# Patient Record
Sex: Male | Born: 1970
Health system: Southern US, Community
[De-identification: ages and names within clinical notes are randomized; demographics above are authoritative.]

## PROBLEM LIST (undated history)

## (undated) DIAGNOSIS — E785 Hyperlipidemia, unspecified: Secondary | ICD-10-CM

## (undated) DIAGNOSIS — I10 Essential (primary) hypertension: Secondary | ICD-10-CM

## (undated) DIAGNOSIS — I2510 Atherosclerotic heart disease of native coronary artery without angina pectoris: Secondary | ICD-10-CM

## (undated) HISTORY — PX: CORONARY ARTERY BYPASS GRAFT: SHX141

## (undated) HISTORY — DX: Atherosclerotic heart disease of native coronary artery without angina pectoris: I25.10

---

## 2011-07-16 ENCOUNTER — Inpatient Hospital Stay (HOSPITAL_COMMUNITY): Payer: BC Managed Care – PPO

## 2011-07-16 ENCOUNTER — Inpatient Hospital Stay (HOSPITAL_COMMUNITY)
Admission: RE | Admit: 2011-07-16 | Discharge: 2011-07-21 | DRG: 107 | Disposition: A | Discharge status: Home / Self Care | Payer: BC Managed Care – PPO | Source: Other Acute Inpatient Hospital | Attending: Cardiothoracic Surgery | Admitting: Cardiothoracic Surgery

## 2011-07-16 ENCOUNTER — Emergency Department (HOSPITAL_BASED_OUTPATIENT_CLINIC_OR_DEPARTMENT_OTHER)
Admission: EM | Admit: 2011-07-16 | Discharge: 2011-07-16 | Disposition: A | Discharge status: Other Acute Inpatient Hospital | Payer: BC Managed Care – PPO | Source: Home / Self Care | Attending: Emergency Medicine | Admitting: Emergency Medicine

## 2011-07-16 ENCOUNTER — Encounter: Payer: Self-pay | Admitting: *Deleted

## 2011-07-16 ENCOUNTER — Emergency Department (INDEPENDENT_AMBULATORY_CARE_PROVIDER_SITE_OTHER): Payer: BC Managed Care – PPO

## 2011-07-16 ENCOUNTER — Other Ambulatory Visit: Payer: Self-pay

## 2011-07-16 ENCOUNTER — Encounter (HOSPITAL_COMMUNITY): Payer: Self-pay

## 2011-07-16 DIAGNOSIS — I2510 Atherosclerotic heart disease of native coronary artery without angina pectoris: Secondary | ICD-10-CM

## 2011-07-16 DIAGNOSIS — R079 Chest pain, unspecified: Secondary | ICD-10-CM | POA: Insufficient documentation

## 2011-07-16 DIAGNOSIS — I498 Other specified cardiac arrhythmias: Secondary | ICD-10-CM | POA: Diagnosis present

## 2011-07-16 DIAGNOSIS — I10 Essential (primary) hypertension: Secondary | ICD-10-CM | POA: Diagnosis present

## 2011-07-16 DIAGNOSIS — I214 Non-ST elevation (NSTEMI) myocardial infarction: Principal | ICD-10-CM | POA: Diagnosis present

## 2011-07-16 DIAGNOSIS — D62 Acute posthemorrhagic anemia: Secondary | ICD-10-CM | POA: Diagnosis not present

## 2011-07-16 DIAGNOSIS — Z01810 Encounter for preprocedural cardiovascular examination: Secondary | ICD-10-CM

## 2011-07-16 DIAGNOSIS — R0789 Other chest pain: Secondary | ICD-10-CM

## 2011-07-16 DIAGNOSIS — R51 Headache: Secondary | ICD-10-CM | POA: Diagnosis not present

## 2011-07-16 DIAGNOSIS — E785 Hyperlipidemia, unspecified: Secondary | ICD-10-CM | POA: Diagnosis present

## 2011-07-16 DIAGNOSIS — E8779 Other fluid overload: Secondary | ICD-10-CM | POA: Diagnosis not present

## 2011-07-16 DIAGNOSIS — I4891 Unspecified atrial fibrillation: Secondary | ICD-10-CM | POA: Diagnosis not present

## 2011-07-16 HISTORY — DX: Essential (primary) hypertension: I10

## 2011-07-16 HISTORY — DX: Hyperlipidemia, unspecified: E78.5

## 2011-07-16 LAB — CARDIAC PANEL(CRET KIN+CKTOT+MB+TROPI)
CK, MB: 8.1 ng/mL (ref 0.3–4.0)
CK, MB: 8.4 ng/mL (ref 0.3–4.0)
CK, MB: 5.6 ng/mL — ABNORMAL HIGH (ref 0.3–4.0)
Relative Index: 3.9 — ABNORMAL HIGH (ref 0.0–2.5)
Relative Index: 4.3 — ABNORMAL HIGH (ref 0.0–2.5)
Relative Index: 3.6 — ABNORMAL HIGH (ref 0.0–2.5)
Total CK: 207 U/L (ref 7–232)
Total CK: 195 U/L (ref 7–232)
Total CK: 154 U/L (ref 7–232)
Troponin I: 2.36 ng/mL (ref <0.30)
Troponin I: 2.87 ng/mL (ref <0.30)
Troponin I: 1.32 ng/mL (ref <0.30)

## 2011-07-16 LAB — CBC
HCT: 41.1 % (ref 39.0–52.0)
HCT: 39.4 % (ref 39.0–52.0)
Hemoglobin: 13.7 g/dL (ref 13.0–17.0)
Hemoglobin: 12.8 g/dL — ABNORMAL LOW (ref 13.0–17.0)
MCH: 20.5 pg — ABNORMAL LOW (ref 26.0–34.0)
MCH: 20.6 pg — ABNORMAL LOW (ref 26.0–34.0)
MCHC: 33.3 g/dL (ref 30.0–36.0)
MCHC: 32.5 g/dL (ref 30.0–36.0)
MCV: 61.4 fL — ABNORMAL LOW (ref 78.0–100.0)
MCV: 63.3 fL — ABNORMAL LOW (ref 78.0–100.0)
Platelets: 252 10*3/uL (ref 150–400)
Platelets: 232 10*3/uL (ref 150–400)
RBC: 6.69 MIL/uL — ABNORMAL HIGH (ref 4.22–5.81)
RBC: 6.22 MIL/uL — ABNORMAL HIGH (ref 4.22–5.81)
RDW: 18.4 % — ABNORMAL HIGH (ref 11.5–15.5)
RDW: 15.2 % (ref 11.5–15.5)
WBC: 7.2 10*3/uL (ref 4.0–10.5)
WBC: 7.9 10*3/uL (ref 4.0–10.5)

## 2011-07-16 LAB — DIFFERENTIAL
Basophils Absolute: 0 10*3/uL (ref 0.0–0.1)
Basophils Relative: 0 % (ref 0–1)
Eosinophils Absolute: 0.1 10*3/uL (ref 0.0–0.7)
Eosinophils Relative: 2 % (ref 0–5)
Lymphocytes Relative: 39 % (ref 12–46)
Lymphs Abs: 2.8 10*3/uL (ref 0.7–4.0)
Monocytes Absolute: 0.6 10*3/uL (ref 0.1–1.0)
Monocytes Relative: 8 % (ref 3–12)
Neutro Abs: 3.7 10*3/uL (ref 1.7–7.7)
Neutrophils Relative %: 51 % (ref 43–77)

## 2011-07-16 LAB — PROTIME-INR
INR: 0.98 (ref 0.00–1.49)
Prothrombin Time: 13.2 seconds (ref 11.6–15.2)

## 2011-07-16 LAB — BASIC METABOLIC PANEL
BUN: 21 mg/dL (ref 6–23)
CO2: 25 mEq/L (ref 19–32)
Calcium: 9.1 mg/dL (ref 8.4–10.5)
Chloride: 102 mEq/L (ref 96–112)
Creatinine, Ser: 1.2 mg/dL (ref 0.50–1.35)
GFR calc Af Amer: >60 mL/min (ref >60)
GFR calc non Af Amer: >60 mL/min (ref >60)
Glucose, Bld: 136 mg/dL — ABNORMAL HIGH (ref 70–99)
Potassium: 3.6 mEq/L (ref 3.5–5.1)
Sodium: 139 mEq/L (ref 135–145)

## 2011-07-16 LAB — GLUCOSE, CAPILLARY
Glucose-Capillary: 116 mg/dL — ABNORMAL HIGH (ref 70–99)
Glucose-Capillary: 95 mg/dL (ref 70–99)
Glucose-Capillary: 137 mg/dL — ABNORMAL HIGH (ref 70–99)

## 2011-07-16 LAB — URINALYSIS, ROUTINE W REFLEX MICROSCOPIC
Bilirubin Urine: NEGATIVE
Glucose, UA: NEGATIVE mg/dL
Hgb urine dipstick: NEGATIVE
Ketones, ur: NEGATIVE mg/dL
Leukocytes, UA: NEGATIVE
Nitrite: NEGATIVE
Protein, ur: NEGATIVE mg/dL
Specific Gravity, Urine: 1.019 (ref 1.005–1.030)
Urobilinogen, UA: 0.2 mg/dL (ref 0.0–1.0)
pH: 6 (ref 5.0–8.0)

## 2011-07-16 LAB — TYPE AND SCREEN
ABO/RH(D): A POS
Antibody Screen: NEGATIVE

## 2011-07-16 LAB — COMPREHENSIVE METABOLIC PANEL
ALT: 20 U/L (ref 0–53)
AST: 21 U/L (ref 0–37)
Albumin: 3.5 g/dL (ref 3.5–5.2)
Alkaline Phosphatase: 35 U/L — ABNORMAL LOW (ref 39–117)
BUN: 18 mg/dL (ref 6–23)
CO2: 26 mEq/L (ref 19–32)
Calcium: 9 mg/dL (ref 8.4–10.5)
Chloride: 107 mEq/L (ref 96–112)
Creatinine, Ser: 1.07 mg/dL (ref 0.50–1.35)
GFR calc Af Amer: >60 mL/min (ref >60)
GFR calc non Af Amer: >60 mL/min (ref >60)
Glucose, Bld: 106 mg/dL — ABNORMAL HIGH (ref 70–99)
Potassium: 3.9 mEq/L (ref 3.5–5.1)
Sodium: 141 mEq/L (ref 135–145)
Total Bilirubin: 0.5 mg/dL (ref 0.3–1.2)
Total Protein: 6.5 g/dL (ref 6.0–8.3)

## 2011-07-16 LAB — LIPID PANEL
Cholesterol: 150 mg/dL (ref 0–200)
HDL: 38 mg/dL — ABNORMAL LOW (ref >39)
LDL Cholesterol: 80 mg/dL (ref 0–99)
Total CHOL/HDL Ratio: 3.9 RATIO
Triglycerides: 158 mg/dL — ABNORMAL HIGH (ref <150)
VLDL: 32 mg/dL (ref 0–40)

## 2011-07-16 LAB — HEPARIN LEVEL (UNFRACTIONATED): Heparin Unfractionated: 0.2 IU/mL — ABNORMAL LOW (ref 0.30–0.70)

## 2011-07-16 LAB — ABO/RH: ABO/RH(D): A POS

## 2011-07-16 LAB — TROPONIN I: Troponin I: <0.3 ng/mL (ref <0.30)

## 2011-07-16 LAB — PRO B NATRIURETIC PEPTIDE: Pro B Natriuretic peptide (BNP): 114.6 pg/mL (ref 0–125)

## 2011-07-16 LAB — APTT: aPTT: 31 seconds (ref 24–37)

## 2011-07-16 LAB — MRSA PCR SCREENING: MRSA by PCR: NEGATIVE

## 2011-07-16 LAB — POCT ACTIVATED CLOTTING TIME: Activated Clotting Time: 133 seconds

## 2011-07-16 LAB — TSH: TSH: 2.213 u[IU]/mL (ref 0.350–4.500)

## 2011-07-16 LAB — URINE MICROSCOPIC-ADD ON

## 2011-07-16 MED ORDER — MORPHINE SULFATE 4 MG/ML IJ SOLN
4.0000 mg | Freq: Once | INTRAMUSCULAR | Status: AC
  Administered 2011-07-16: 4 mg via INTRAVENOUS
  Filled 2011-07-16: qty 1

## 2011-07-16 MED ORDER — LABETALOL HCL 5 MG/ML IV SOLN: 0.5000 mg/min | INTRAVENOUS | Status: DC

## 2011-07-16 MED ORDER — NITROGLYCERIN IN D5W 200-5 MCG/ML-% IV SOLN
2.0000 ug/min | Freq: Once | INTRAVENOUS | Status: AC
  Administered 2011-07-16: 10 ug/min via INTRAVENOUS

## 2011-07-16 MED ORDER — NITROGLYCERIN IN D5W 200-5 MCG/ML-% IV SOLN
INTRAVENOUS | Status: AC
  Administered 2011-07-16: 10 ug/min via INTRAVENOUS
  Filled 2011-07-16: qty 250

## 2011-07-16 MED ORDER — NITROGLYCERIN 0.4 MG SL SUBL
0.4000 mg | SUBLINGUAL_TABLET | SUBLINGUAL | Status: DC | PRN
  Administered 2011-07-16: 0.4 mg via SUBLINGUAL
  Filled 2011-07-16: qty 25

## 2011-07-16 MED ORDER — MORPHINE SULFATE 4 MG/ML IJ SOLN
INTRAMUSCULAR | Status: AC
  Administered 2011-07-16: 4 mg via INTRAVENOUS
  Filled 2011-07-16: qty 1

## 2011-07-16 MED ORDER — ASPIRIN 325 MG PO TABS
325.0000 mg | ORAL_TABLET | Freq: Once | ORAL | Status: AC
  Administered 2011-07-16: 325 mg via ORAL
  Filled 2011-07-16: qty 1

## 2011-07-16 MED ORDER — ONDANSETRON HCL 4 MG/2ML IJ SOLN
4.0000 mg | Freq: Once | INTRAMUSCULAR | Status: AC
  Administered 2011-07-16: 4 mg via INTRAVENOUS
  Filled 2011-07-16: qty 2

## 2011-07-16 MED ORDER — MORPHINE SULFATE 4 MG/ML IJ SOLN
4.0000 mg | Freq: Once | INTRAMUSCULAR | Status: AC
  Administered 2011-07-16: 4 mg via INTRAVENOUS

## 2011-07-16 NOTE — ED Notes (Signed)
Pt returned from xray in stable condition.  Pt placed back on monitor and Nitro gtts increased to .  Pt remains pain free at present and family is at bedside

## 2011-07-16 NOTE — ED Provider Notes (Signed)
History     CSN: [id number] Arrival date & time: 07/16/2011 12:42 AM  Chief Complaint  Patient presents with  . Chest Pain   Patient is a 40 y.o. male presenting with chest pain. The history is provided by the patient.  Chest Pain The chest pain began 1 - 2 hours ago (around 10pm). Duration of episode(s) is 2 hours. Chest pain occurs constantly. The chest pain is unchanged. Associated with: nothing. At its most intense, the pain is at 5/10. The pain is currently at 3/10. The severity of the pain is moderate. The quality of the pain is described as pressure-like. The pain does not radiate. Exacerbated by: nothing. Primary symptoms include nausea. Pertinent negatives for primary symptoms include no fever, no syncope, no shortness of breath, no cough, no wheezing, no abdominal pain, no vomiting and no dizziness.  Associated symptoms include diaphoresis.  Pertinent negatives for associated symptoms include no claudication. He tried nothing for the symptoms.  His past medical history is significant for hypertension.  Pertinent negatives for past medical history include no CAD.   HAd ASA and NTG in route Past Medical History  Diagnosis Date  . Hypertension   . Hyperlipidemia     History reviewed. No pertinent past surgical history.  History reviewed. No pertinent family history.  History  Substance Use Topics  . Smoking status: Never Smoker   . Smokeless tobacco: Not on file  . Alcohol Use: No      Review of Systems  Constitutional: Positive for diaphoresis. Negative for fever and chills.  HENT: Negative for neck pain and neck stiffness.   Eyes: Negative for pain.  Respiratory: Negative for cough, shortness of breath and wheezing.   Cardiovascular: Positive for chest pain. Negative for claudication and syncope.  Gastrointestinal: Positive for nausea. Negative for vomiting and abdominal pain.  Genitourinary: Negative for dysuria.  Musculoskeletal: Negative for back pain.  Skin:  Negative for rash.  Neurological: Negative for dizziness and headaches.  All other systems reviewed and are negative.    Physical Exam  BP 162/111  Pulse 79  Temp(Src) 99.5 F (37.5 C) (Oral)  Resp 20  SpO2 100%  Physical Exam  Constitutional: He is oriented to person, place, and time. He appears well-developed and well-nourished.  HENT:  Head: Normocephalic and atraumatic.  Eyes: Conjunctivae and EOM are normal. Pupils are equal, round, and reactive to light.  Neck: Trachea normal. Neck supple. No thyromegaly present.  Cardiovascular: Normal rate, regular rhythm, S1 normal, S2 normal and normal pulses.     No systolic murmur is present   No diastolic murmur is present  Pulses:      Radial pulses are 2+ on the right side, and 2+ on the left side.  Pulmonary/Chest: Effort normal and breath sounds normal. He has no wheezes. He has no rhonchi. He has no rales. He exhibits no tenderness.  Abdominal: Soft. Normal appearance and bowel sounds are normal. There is no tenderness. There is no CVA tenderness and negative Murphy's sign.  Musculoskeletal:       BLE:s Calves nontender, no cords or erythema, negative Homans sign  Neurological: He is alert and oriented to person, place, and time. He has normal strength. No cranial nerve deficit or sensory deficit. GCS eye subscore is 4. GCS verbal subscore is 5. GCS motor subscore is 6.  Skin: Skin is warm and dry. No rash noted. He is not diaphoretic.  Psychiatric: His speech is normal.       Cooperative and  appropriate    ED Course  Procedures   Date: 07/16/2011  Rate: 111  Rhythm: sinus tachycardia  QRS Axis: left  Intervals: normal  ST/T Wave abnormalities: nonspecific ST changes  Conduction Disutrbances:none  Narrative Interpretation:   Old EKG Reviewed: none available Some ST depression V3-V6   MDM CP concerning for possible ACS has h/o HTN and is very hypertensive today. IV Labetolol drip ordered and NTG improved CP. CXR  and labs ordered/ reviewed. Pain resolved. MED c/s for admit. At 0340 d/w Dr Harlow Asa who acepts for admit step down ICU at [facility].   Dg Chest 2 View  07/16/2011  *RADIOLOGY REPORT*  Clinical Data: Left-sided chest pain.  CHEST - 2 VIEW  Comparison: None.  Findings: Mild retrocardiac opacity.  Lungs are otherwise clear. No pleural effusion or pneumothorax. The cardiomediastinal contours are within normal limits. The visualized bones and soft tissues are without significant appreciable abnormality.  IMPRESSION: Mild streaky retrocardiac opacity; likely atelectasis or scarring.  Original Report Authenticated By: Waneta Martins, M.D.   Results for orders placed during the hospital encounter of 07/16/11 (from the past 24 hour(s))  CBC     Status: Abnormal   Collection Time   07/16/11 12:49 AM      Component Value Range   WBC 7.2  4.0 - 10.5 (K/uL)   RBC 6.69 (*) 4.22 - 5.81 (MIL/uL)   Hemoglobin 13.7  13.0 - 17.0 (g/dL)   HCT 16.1  09.6 - 04.5 (%)   MCV 61.4 (*) 78.0 - 100.0 (fL)   MCH 20.5 (*) 26.0 - 34.0 (pg)   MCHC 33.3  30.0 - 36.0 (g/dL)   RDW 40.9 (*) 81.1 - 15.5 (%)   Platelets 252  150 - 400 (K/uL)  DIFFERENTIAL     Status: Normal   Collection Time   07/16/11 12:49 AM      Component Value Range   Neutrophils Relative 51  43 - 77 (%)   Lymphocytes Relative 39  12 - 46 (%)   Monocytes Relative 8  3 - 12 (%)   Eosinophils Relative 2  0 - 5 (%)   Basophils Relative 0  0 - 1 (%)   Neutro Abs 3.7  1.7 - 7.7 (K/uL)   Lymphs Abs 2.8  0.7 - 4.0 (K/uL)   Monocytes Absolute 0.6  0.1 - 1.0 (K/uL)   Eosinophils Absolute 0.1  0.0 - 0.7 (K/uL)   Basophils Absolute 0.0  0.0 - 0.1 (K/uL)   RBC Morphology TARGET CELLS    BASIC METABOLIC PANEL     Status: Abnormal   Collection Time   07/16/11 12:49 AM      Component Value Range   Sodium 139  135 - 145 (mEq/L)   Potassium 3.6  3.5 - 5.1 (mEq/L)   Chloride 102  96 - 112 (mEq/L)   CO2 25  19 - 32 (mEq/L)   Glucose, Bld 136 (*) 70 - 99  (mg/dL)   BUN 21  6 - 23 (mg/dL)   Creatinine, Ser 9.14  0.50 - 1.35 (mg/dL)   Calcium 9.1  8.4 - 78.2 (mg/dL)   GFR calc non Af Amer >60  >60 (mL/min)   GFR calc Af Amer >60  >60 (mL/min)  TROPONIN I     Status: Normal   Collection Time   07/16/11 12:49 AM      Component Value Range   Troponin I <0.30  <0.30 (ng/mL)         Arlys John  Dierdre Highman, MD 07/16/11 [phone]

## 2011-07-16 NOTE — ED Notes (Signed)
Pt reports no CP at this time

## 2011-07-16 NOTE — ED Notes (Signed)
Pt. Reports L side chest pain started at 2200 and again at midnight.  Pt. Reports he had to poop when the 1st chest pain occurred and reports no vomiting or nausea.

## 2011-07-17 ENCOUNTER — Inpatient Hospital Stay (HOSPITAL_COMMUNITY): Payer: BC Managed Care – PPO

## 2011-07-17 DIAGNOSIS — I2510 Atherosclerotic heart disease of native coronary artery without angina pectoris: Secondary | ICD-10-CM

## 2011-07-17 HISTORY — PX: OTHER SURGICAL HISTORY: SHX169

## 2011-07-17 LAB — CBC
HCT: 38.3 % — ABNORMAL LOW (ref 39.0–52.0)
HCT: 30.8 % — ABNORMAL LOW (ref 39.0–52.0)
HCT: 26.3 % — ABNORMAL LOW (ref 39.0–52.0)
Hemoglobin: 12.1 g/dL — ABNORMAL LOW (ref 13.0–17.0)
Hemoglobin: 9.9 g/dL — ABNORMAL LOW (ref 13.0–17.0)
Hemoglobin: 8.6 g/dL — ABNORMAL LOW (ref 13.0–17.0)
MCH: 20.1 pg — ABNORMAL LOW (ref 26.0–34.0)
MCH: 20.3 pg — ABNORMAL LOW (ref 26.0–34.0)
MCH: 20.5 pg — ABNORMAL LOW (ref 26.0–34.0)
MCHC: 31.6 g/dL (ref 30.0–36.0)
MCHC: 32.1 g/dL (ref 30.0–36.0)
MCHC: 32.7 g/dL (ref 30.0–36.0)
MCV: 63.7 fL — ABNORMAL LOW (ref 78.0–100.0)
MCV: 63.1 fL — ABNORMAL LOW (ref 78.0–100.0)
MCV: 62.6 fL — ABNORMAL LOW (ref 78.0–100.0)
Platelets: 235 10*3/uL (ref 150–400)
Platelets: 95 10*3/uL — ABNORMAL LOW (ref 150–400)
Platelets: 101 10*3/uL — ABNORMAL LOW (ref 150–400)
RBC: 6.01 MIL/uL — ABNORMAL HIGH (ref 4.22–5.81)
RBC: 4.88 MIL/uL (ref 4.22–5.81)
RBC: 4.2 MIL/uL — ABNORMAL LOW (ref 4.22–5.81)
RDW: 15.7 % — ABNORMAL HIGH (ref 11.5–15.5)
RDW: 15.1 % (ref 11.5–15.5)
RDW: 15.2 % (ref 11.5–15.5)
WBC: 9.4 10*3/uL (ref 4.0–10.5)
WBC: 9.9 10*3/uL (ref 4.0–10.5)
WBC: 7.9 10*3/uL (ref 4.0–10.5)

## 2011-07-17 LAB — COMPREHENSIVE METABOLIC PANEL
ALT: 21 U/L (ref 0–53)
AST: 20 U/L (ref 0–37)
Albumin: 3.4 g/dL — ABNORMAL LOW (ref 3.5–5.2)
Alkaline Phosphatase: 39 U/L (ref 39–117)
BUN: 13 mg/dL (ref 6–23)
CO2: 26 mEq/L (ref 19–32)
Calcium: 8.8 mg/dL (ref 8.4–10.5)
Chloride: 104 mEq/L (ref 96–112)
Creatinine, Ser: 1.11 mg/dL (ref 0.50–1.35)
GFR calc Af Amer: >60 mL/min (ref >60)
GFR calc non Af Amer: >60 mL/min (ref >60)
Glucose, Bld: 104 mg/dL — ABNORMAL HIGH (ref 70–99)
Potassium: 3.9 mEq/L (ref 3.5–5.1)
Sodium: 138 mEq/L (ref 135–145)
Total Bilirubin: 0.4 mg/dL (ref 0.3–1.2)
Total Protein: 6.2 g/dL (ref 6.0–8.3)

## 2011-07-17 LAB — GLUCOSE, CAPILLARY
Glucose-Capillary: 76 mg/dL (ref 70–99)
Glucose-Capillary: 109 mg/dL — ABNORMAL HIGH (ref 70–99)
Glucose-Capillary: 100 mg/dL — ABNORMAL HIGH (ref 70–99)
Glucose-Capillary: 124 mg/dL — ABNORMAL HIGH (ref 70–99)

## 2011-07-17 LAB — POCT I-STAT 4, (NA,K, GLUC, HGB,HCT)
Glucose, Bld: 135 mg/dL — ABNORMAL HIGH (ref 70–99)
Glucose, Bld: 126 mg/dL — ABNORMAL HIGH (ref 70–99)
Glucose, Bld: 102 mg/dL — ABNORMAL HIGH (ref 70–99)
Glucose, Bld: 113 mg/dL — ABNORMAL HIGH (ref 70–99)
Glucose, Bld: 126 mg/dL — ABNORMAL HIGH (ref 70–99)
Glucose, Bld: 95 mg/dL (ref 70–99)
HCT: 40 % (ref 39.0–52.0)
HCT: 35 % — ABNORMAL LOW (ref 39.0–52.0)
HCT: 29 % — ABNORMAL LOW (ref 39.0–52.0)
HCT: 30 % — ABNORMAL LOW (ref 39.0–52.0)
HCT: 30 % — ABNORMAL LOW (ref 39.0–52.0)
HCT: 32 % — ABNORMAL LOW (ref 39.0–52.0)
Hemoglobin: 13.6 g/dL (ref 13.0–17.0)
Hemoglobin: 11.9 g/dL — ABNORMAL LOW (ref 13.0–17.0)
Hemoglobin: 9.9 g/dL — ABNORMAL LOW (ref 13.0–17.0)
Hemoglobin: 10.2 g/dL — ABNORMAL LOW (ref 13.0–17.0)
Hemoglobin: 10.2 g/dL — ABNORMAL LOW (ref 13.0–17.0)
Hemoglobin: 10.9 g/dL — ABNORMAL LOW (ref 13.0–17.0)
Potassium: 3.6 mEq/L (ref 3.5–5.1)
Potassium: 3.7 mEq/L (ref 3.5–5.1)
Potassium: 3.4 mEq/L — ABNORMAL LOW (ref 3.5–5.1)
Potassium: 3.8 mEq/L (ref 3.5–5.1)
Potassium: 3.8 mEq/L (ref 3.5–5.1)
Potassium: 3.7 mEq/L (ref 3.5–5.1)
Sodium: 139 mEq/L (ref 135–145)
Sodium: 138 mEq/L (ref 135–145)
Sodium: 135 mEq/L (ref 135–145)
Sodium: 148 mEq/L — ABNORMAL HIGH (ref 135–145)
Sodium: 141 mEq/L (ref 135–145)
Sodium: 143 mEq/L (ref 135–145)

## 2011-07-17 LAB — URINE DRUGS OF ABUSE SCREEN W ALC, ROUTINE (REF LAB)
Amphetamine Screen, Ur: NEGATIVE
Barbiturate Quant, Ur: NEGATIVE
Benzodiazepines.: NEGATIVE
Cocaine Metabolites: NEGATIVE
Creatinine,U: 125.5 mg/dL
Ethyl Alcohol: <10 mg/dL (ref <10)
Marijuana Metabolite: NEGATIVE
Methadone: NEGATIVE
Opiate Screen, Urine: POSITIVE
Phencyclidine (PCP): NEGATIVE
Propoxyphene: NEGATIVE

## 2011-07-17 LAB — POCT I-STAT 3, ART BLOOD GAS (G3+)
Acid-base deficit: 2 mmol/L (ref 0.0–2.0)
Acid-base deficit: 2 mmol/L (ref 0.0–2.0)
Acid-base deficit: 4 mmol/L — ABNORMAL HIGH (ref 0.0–2.0)
Bicarbonate: 22.4 mEq/L (ref 20.0–24.0)
Bicarbonate: 22.2 mEq/L (ref 20.0–24.0)
Bicarbonate: 20.5 mEq/L (ref 20.0–24.0)
O2 Saturation: 100 %
O2 Saturation: 98 %
O2 Saturation: 97 %
Patient temperature: 36.4
Patient temperature: 39
TCO2: 23 mmol/L (ref 0–100)
TCO2: 23 mmol/L (ref 0–100)
TCO2: 22 mmol/L (ref 0–100)
pCO2 arterial: 37.3 mmHg (ref 35.0–45.0)
pCO2 arterial: 35.4 mmHg (ref 35.0–45.0)
pCO2 arterial: 38.7 mmHg (ref 35.0–45.0)
pH, Arterial: 7.387 (ref 7.350–7.450)
pH, Arterial: 7.402 (ref 7.350–7.450)
pH, Arterial: 7.34 — ABNORMAL LOW (ref 7.350–7.450)
pO2, Arterial: 281 mmHg — ABNORMAL HIGH (ref 80.0–100.0)
pO2, Arterial: 103 mmHg — ABNORMAL HIGH (ref 80.0–100.0)
pO2, Arterial: 100 mmHg (ref 80.0–100.0)

## 2011-07-17 LAB — BLOOD GAS, ARTERIAL
Acid-base deficit: 0.1 mmol/L (ref 0.0–2.0)
Bicarbonate: 24.2 mEq/L — ABNORMAL HIGH (ref 20.0–24.0)
Drawn by: 155271
FIO2: 0.21 %
O2 Saturation: 96.2 %
Patient temperature: 98.6
TCO2: 25.4 mmol/L (ref 0–100)
pCO2 arterial: 40 mmHg (ref 35.0–45.0)
pH, Arterial: 7.398 (ref 7.350–7.450)
pO2, Arterial: 68.3 mmHg — ABNORMAL LOW (ref 80.0–100.0)

## 2011-07-17 LAB — POCT I-STAT, CHEM 8
BUN: 13 mg/dL (ref 6–23)
Calcium, Ion: 1.11 mmol/L — ABNORMAL LOW (ref 1.12–1.32)
Chloride: 110 mEq/L (ref 96–112)
Creatinine, Ser: 1.1 mg/dL (ref 0.50–1.35)
Glucose, Bld: 124 mg/dL — ABNORMAL HIGH (ref 70–99)
HCT: 27 % — ABNORMAL LOW (ref 39.0–52.0)
Hemoglobin: 9.2 g/dL — ABNORMAL LOW (ref 13.0–17.0)
Potassium: 4.2 mEq/L (ref 3.5–5.1)
Sodium: 143 mEq/L (ref 135–145)
TCO2: 19 mmol/L (ref 0–100)

## 2011-07-17 LAB — LIPID PANEL
Cholesterol: 137 mg/dL (ref 0–200)
HDL: 37 mg/dL — ABNORMAL LOW (ref >39)
LDL Cholesterol: 42 mg/dL (ref 0–99)
Total CHOL/HDL Ratio: 3.7 RATIO
Triglycerides: 290 mg/dL — ABNORMAL HIGH (ref <150)
VLDL: 58 mg/dL — ABNORMAL HIGH (ref 0–40)

## 2011-07-17 LAB — CREATININE, SERUM
Creatinine, Ser: 0.94 mg/dL (ref 0.50–1.35)
GFR calc Af Amer: >60 mL/min (ref >60)
GFR calc non Af Amer: >60 mL/min (ref >60)

## 2011-07-17 LAB — PROTIME-INR
INR: 1.01 (ref 0.00–1.49)
INR: 1.4 (ref 0.00–1.49)
Prothrombin Time: 13.5 seconds (ref 11.6–15.2)
Prothrombin Time: 17.4 seconds — ABNORMAL HIGH (ref 11.6–15.2)

## 2011-07-17 LAB — HEMOGLOBIN A1C
Hgb A1c MFr Bld: 6.6 % — ABNORMAL HIGH (ref <5.7)
Hgb A1c MFr Bld: 6.6 % — ABNORMAL HIGH (ref <5.7)
Mean Plasma Glucose: 143 mg/dL — ABNORMAL HIGH (ref <117)
Mean Plasma Glucose: 143 mg/dL — ABNORMAL HIGH (ref <117)

## 2011-07-17 LAB — APTT
aPTT: 74 seconds — ABNORMAL HIGH (ref 24–37)
aPTT: 40 seconds — ABNORMAL HIGH (ref 24–37)

## 2011-07-17 LAB — HEPARIN LEVEL (UNFRACTIONATED): Heparin Unfractionated: 0.32 IU/mL (ref 0.30–0.70)

## 2011-07-17 LAB — MAGNESIUM: Magnesium: 2.8 mg/dL — ABNORMAL HIGH (ref 1.5–2.5)

## 2011-07-17 LAB — PLATELET COUNT: Platelets: 160 10*3/uL (ref 150–400)

## 2011-07-17 LAB — HEMOGLOBIN AND HEMATOCRIT, BLOOD
HCT: 29.2 % — ABNORMAL LOW (ref 39.0–52.0)
Hemoglobin: 9.5 g/dL — ABNORMAL LOW (ref 13.0–17.0)

## 2011-07-18 ENCOUNTER — Inpatient Hospital Stay (HOSPITAL_COMMUNITY): Payer: BC Managed Care – PPO

## 2011-07-18 LAB — CBC
HCT: 25.3 % — ABNORMAL LOW (ref 39.0–52.0)
HCT: 25.2 % — ABNORMAL LOW (ref 39.0–52.0)
Hemoglobin: 8.2 g/dL — ABNORMAL LOW (ref 13.0–17.0)
Hemoglobin: 8.2 g/dL — ABNORMAL LOW (ref 13.0–17.0)
MCH: 20.5 pg — ABNORMAL LOW (ref 26.0–34.0)
MCH: 20.6 pg — ABNORMAL LOW (ref 26.0–34.0)
MCHC: 32.4 g/dL (ref 30.0–36.0)
MCHC: 32.5 g/dL (ref 30.0–36.0)
MCV: 63.3 fL — ABNORMAL LOW (ref 78.0–100.0)
MCV: 63.3 fL — ABNORMAL LOW (ref 78.0–100.0)
Platelets: 112 10*3/uL — ABNORMAL LOW (ref 150–400)
Platelets: 109 10*3/uL — ABNORMAL LOW (ref 150–400)
RBC: 4 MIL/uL — ABNORMAL LOW (ref 4.22–5.81)
RBC: 3.98 MIL/uL — ABNORMAL LOW (ref 4.22–5.81)
RDW: 15.4 % (ref 11.5–15.5)
RDW: 15.7 % — ABNORMAL HIGH (ref 11.5–15.5)
WBC: 8.8 10*3/uL (ref 4.0–10.5)
WBC: 8.9 10*3/uL (ref 4.0–10.5)

## 2011-07-18 LAB — BASIC METABOLIC PANEL
BUN: 15 mg/dL (ref 6–23)
BUN: 16 mg/dL (ref 6–23)
CO2: 25 mEq/L (ref 19–32)
CO2: 26 mEq/L (ref 19–32)
Calcium: 7.2 mg/dL — ABNORMAL LOW (ref 8.4–10.5)
Calcium: 7.7 mg/dL — ABNORMAL LOW (ref 8.4–10.5)
Chloride: 113 mEq/L — ABNORMAL HIGH (ref 96–112)
Chloride: 108 mEq/L (ref 96–112)
Creatinine, Ser: 1.02 mg/dL (ref 0.50–1.35)
Creatinine, Ser: 1 mg/dL (ref 0.50–1.35)
GFR calc Af Amer: >60 mL/min (ref >60)
GFR calc Af Amer: >60 mL/min (ref >60)
GFR calc non Af Amer: >60 mL/min (ref >60)
GFR calc non Af Amer: >60 mL/min (ref >60)
Glucose, Bld: 108 mg/dL — ABNORMAL HIGH (ref 70–99)
Glucose, Bld: 141 mg/dL — ABNORMAL HIGH (ref 70–99)
Potassium: 4 mEq/L (ref 3.5–5.1)
Potassium: 4.1 mEq/L (ref 3.5–5.1)
Sodium: 143 mEq/L (ref 135–145)
Sodium: 139 mEq/L (ref 135–145)

## 2011-07-18 LAB — GLUCOSE, CAPILLARY
Glucose-Capillary: 113 mg/dL — ABNORMAL HIGH (ref 70–99)
Glucose-Capillary: 122 mg/dL — ABNORMAL HIGH (ref 70–99)
Glucose-Capillary: 116 mg/dL — ABNORMAL HIGH (ref 70–99)
Glucose-Capillary: 135 mg/dL — ABNORMAL HIGH (ref 70–99)
Glucose-Capillary: 122 mg/dL — ABNORMAL HIGH (ref 70–99)

## 2011-07-18 LAB — POCT I-STAT, CHEM 8
BUN: 16 mg/dL (ref 6–23)
Calcium, Ion: 1.16 mmol/L (ref 1.12–1.32)
Chloride: 105 mEq/L (ref 96–112)
Creatinine, Ser: 1.1 mg/dL (ref 0.50–1.35)
Glucose, Bld: 137 mg/dL — ABNORMAL HIGH (ref 70–99)
HCT: 28 % — ABNORMAL LOW (ref 39.0–52.0)
Hemoglobin: 9.5 g/dL — ABNORMAL LOW (ref 13.0–17.0)
Potassium: 4.2 mEq/L (ref 3.5–5.1)
Sodium: 140 mEq/L (ref 135–145)
TCO2: 24 mmol/L (ref 0–100)

## 2011-07-18 LAB — MAGNESIUM
Magnesium: 2.5 mg/dL (ref 1.5–2.5)
Magnesium: 2.5 mg/dL (ref 1.5–2.5)

## 2011-07-19 ENCOUNTER — Inpatient Hospital Stay (HOSPITAL_COMMUNITY): Payer: BC Managed Care – PPO

## 2011-07-19 LAB — GLUCOSE, CAPILLARY
Glucose-Capillary: 116 mg/dL — ABNORMAL HIGH (ref 70–99)
Glucose-Capillary: 129 mg/dL — ABNORMAL HIGH (ref 70–99)
Glucose-Capillary: 130 mg/dL — ABNORMAL HIGH (ref 70–99)
Glucose-Capillary: 134 mg/dL — ABNORMAL HIGH (ref 70–99)
Glucose-Capillary: 138 mg/dL — ABNORMAL HIGH (ref 70–99)
Glucose-Capillary: 151 mg/dL — ABNORMAL HIGH (ref 70–99)

## 2011-07-19 LAB — BASIC METABOLIC PANEL
BUN: 17 mg/dL (ref 6–23)
CO2: 27 mEq/L (ref 19–32)
Calcium: 7.9 mg/dL — ABNORMAL LOW (ref 8.4–10.5)
Chloride: 107 mEq/L (ref 96–112)
Creatinine, Ser: 1.06 mg/dL (ref 0.50–1.35)
GFR calc Af Amer: >60 mL/min (ref >60)
GFR calc non Af Amer: >60 mL/min (ref >60)
Glucose, Bld: 122 mg/dL — ABNORMAL HIGH (ref 70–99)
Potassium: 4 mEq/L (ref 3.5–5.1)
Sodium: 139 mEq/L (ref 135–145)

## 2011-07-19 LAB — OPIATE, QUANTITATIVE, URINE
Codeine Urine: NEGATIVE NG/ML
Hydrocodone: NEGATIVE NG/ML
Hydromorphone GC/MS Conf: NEGATIVE NG/ML
Morphine, Confirm: 4733 NG/ML — ABNORMAL HIGH
Oxycodone, ur: NEGATIVE NG/ML
Oxymorphone: NEGATIVE NG/ML

## 2011-07-19 LAB — CBC
HCT: 26 % — ABNORMAL LOW (ref 39.0–52.0)
Hemoglobin: 8.3 g/dL — ABNORMAL LOW (ref 13.0–17.0)
MCH: 20.2 pg — ABNORMAL LOW (ref 26.0–34.0)
MCHC: 31.9 g/dL (ref 30.0–36.0)
MCV: 63.4 fL — ABNORMAL LOW (ref 78.0–100.0)
Platelets: 118 10*3/uL — ABNORMAL LOW (ref 150–400)
RBC: 4.1 MIL/uL — ABNORMAL LOW (ref 4.22–5.81)
RDW: 15.9 % — ABNORMAL HIGH (ref 11.5–15.5)
WBC: 11.5 10*3/uL — ABNORMAL HIGH (ref 4.0–10.5)

## 2011-07-20 ENCOUNTER — Inpatient Hospital Stay (HOSPITAL_COMMUNITY): Payer: BC Managed Care – PPO

## 2011-07-20 LAB — CBC
HCT: 25.5 % — ABNORMAL LOW (ref 39.0–52.0)
Hemoglobin: 8.3 g/dL — ABNORMAL LOW (ref 13.0–17.0)
MCH: 20.6 pg — ABNORMAL LOW (ref 26.0–34.0)
MCHC: 32.5 g/dL (ref 30.0–36.0)
MCV: 63.3 fL — ABNORMAL LOW (ref 78.0–100.0)
Platelets: 194 10*3/uL (ref 150–400)
RBC: 4.03 MIL/uL — ABNORMAL LOW (ref 4.22–5.81)
RDW: 15.6 % — ABNORMAL HIGH (ref 11.5–15.5)
WBC: 11.4 10*3/uL — ABNORMAL HIGH (ref 4.0–10.5)

## 2011-07-20 LAB — GLUCOSE, CAPILLARY
Glucose-Capillary: 137 mg/dL — ABNORMAL HIGH (ref 70–99)
Glucose-Capillary: 124 mg/dL — ABNORMAL HIGH (ref 70–99)
Glucose-Capillary: 114 mg/dL — ABNORMAL HIGH (ref 70–99)
Glucose-Capillary: 139 mg/dL — ABNORMAL HIGH (ref 70–99)
Glucose-Capillary: 120 mg/dL — ABNORMAL HIGH (ref 70–99)
Glucose-Capillary: 119 mg/dL — ABNORMAL HIGH (ref 70–99)
Glucose-Capillary: 218 mg/dL — ABNORMAL HIGH (ref 70–99)

## 2011-07-20 LAB — BASIC METABOLIC PANEL
BUN: 23 mg/dL (ref 6–23)
CO2: 27 mEq/L (ref 19–32)
Calcium: 8.5 mg/dL (ref 8.4–10.5)
Chloride: 105 mEq/L (ref 96–112)
Creatinine, Ser: 1.1 mg/dL (ref 0.50–1.35)
GFR calc Af Amer: >60 mL/min (ref >60)
GFR calc non Af Amer: >60 mL/min (ref >60)
Glucose, Bld: 131 mg/dL — ABNORMAL HIGH (ref 70–99)
Potassium: 4.1 mEq/L (ref 3.5–5.1)
Sodium: 140 mEq/L (ref 135–145)

## 2011-07-21 LAB — GLUCOSE, CAPILLARY
Glucose-Capillary: 104 mg/dL — ABNORMAL HIGH (ref 70–99)
Glucose-Capillary: 92 mg/dL (ref 70–99)

## 2011-07-21 NOTE — Cardiovascular Report (Signed)
NAMEAMARIYON, [name]            ACCOUNT NO.:  [id number]  MEDICAL RECORD NO.:  [id number]  LOCATION:  2115                         FACILITY:  [facility]  PHYSICIAN:  Thereasa Solo. Little, M.D. DATE OF BIRTH:  1971/01/10  DATE OF PROCEDURE: DATE OF DISCHARGE:                           CARDIAC CATHETERIZATION   DATE OF PROCEDURE:  July 16, 2011.  INDICATIONS FOR PROCEDURE:  This 40 year old male who has had several weeks of exertional angina, was admitted with ST depression in the lateral leads.  His initial troponin was negative, but his second troponin came back greater than 2.  He is brought to the Cath Lab for definitive evaluation of his cardiac anatomy.  After obtaining informed consent, the patient was prepped and draped in the usual sterile fashion exposing the right groin.  Following local anesthetic with 1% Xylocaine, the Seldinger technique employed and a 5- [place] introducer sheath was placed in the right femoral artery.  Left and right coronary arteriography, ventriculography, and a distal aortogram was performed.  COMPLICATIONS:  None.  TOTAL CONTRAST USED:  100 mL.  EQUIPMENT:  5-French Judkins configuration catheters.  RESULTS: 1. Hemodynamic monitoring:  Central aortic pressure 117/86.  Left     ventricular pressure was 114/5 with no gradient noted at the time     of pullback across the aortic valve. 2. Ventriculography.  Ventriculography in the RAO projection revealed     normal systolic function with no wall motion abnormalities and end-     diastolic pressure of 12.  Ejection fraction was in excess of 55%. 3. Distal aortogram done above the level of the renal artery showed no     evidence of renal artery stenosis.  No iliac disease. 4. Coronary arteriography:  On fluoroscopy, there was calcification in     the proximal portion of the LAD. 5. Left main was normal. 6. Circumflex.  This was a very large and dilated vessel.  There was     some aneurysmal  dilatation in the midportion.  It gave rise to four     OMs.  The first OM was free of disease and relatively small, but     graftable.  The second OM was a medium-sized vessel with 70% ostial     narrowing.  The third OM had a 90% area of ostial narrowing and was     a largest of all the vessels and the ongoing circumflex gave rise     to a fourth OM that was free of disease. 7. LAD.  The LAD was functionally occluded proximally.  There were     skipped collaterals and there was late filling of the LAD.  There     was a very large right-to-left collateral bed to the LAD and you     could actually see the LAD filled from the apex all the way back to     the proximal LAD and filling down the first diagonal. 8. Ramus intermedius.  This vessel was not visualized, antegrade, but     there was late filling retrograde. 9. Right coronary artery.  This vessel was dilated and had several     aneurysmal segments, that was diffused, but minor irregularities  throughout the RCA itself.  The PDA had an area of 70% proximal     narrowing.  The posterior lateral vessel was large and free of     disease, and there was large right-to-left collaterals.  CONCLUSION: 1. Severe LAD, circumflex, and intermediate disease with moderate     disease in the PDA. 2. Normal LV systolic function. 3. No distal aortic or iliac disease.  I have referred the patient to CVTS for consideration of revascularization.          ______________________________ [name], M.D.     ABL/MEDQ  D:  07/16/2011  T:  07/16/2011  Job:  [id number]  Electronically Signed by Julieanne Manson M.D. on 07/21/2011 02:33:41 PM

## 2011-07-21 NOTE — Op Note (Signed)
NAMEFREDERIK, [name]            ACCOUNT NO.:  [id number]  MEDICAL RECORD NO.:  [id number]  LOCATION:  2309                         FACILITY:  [facility]  PHYSICIAN:  Sheliah Plane, MD    DATE OF BIRTH:  [birth year]  DATE OF PROCEDURE:  07/17/2011 DATE OF DISCHARGE:                              OPERATIVE REPORT   PREOPERATIVE DIAGNOSIS:  Coronary occlusive disease with recent non ST- elevation myocardial infarction.  POSTOPERATIVE DIAGNOSIS:  Coronary occlusive disease with recent non ST- elevation myocardial infarction.  SURGICAL PROCEDURES:  Coronary artery bypass grafting x6 with left internal mammary to left anterior descending coronary artery, sequential reverse saphenous vein graft to the first diagonal and ramus intermediate, sequential reverse saphenous vein graft to the second and third obtuse marginal, reverse saphenous vein graft to the posterior descending coronary artery with right leg endovein harvesting.  SURGEON:  Sheliah Plane, MD.  FIRST ASSISTANT:  Salvatore Decent. Dorris Fetch, MD.  SECOND ASSISTANT:  Zadie Rhine, PA.  BRIEF HISTORY:  The patient is a 40 year old male not known to be diabetic, but with an elevated hemoglobin A1c of 6.6, who presents with new onset of prolonged chest pain.  Cardiac enzymes were minimally elevated.  Cardiac catheterization was performed, which demonstrated total occlusion of the LAD with collateral filling from the right 70% proximal posterior descending, 90% second obtuse marginal, 70% third obtuse marginal with aneurysmal dilatation of the entire right coronary artery and circumflex coronary artery.  LV function was preserved.  With the patient's diffuse disease and significant stenosis, coronary artery bypass grafting was recommended.  The risks and options were discussed with the patient in detail and was willing to proceed and signed informed consent.  DESCRIPTION OF PROCEDURE:  With Swan-Ganz and arterial line  monitors in place, the patient underwent general endotracheal anesthesia without incident.  The skin of chest and legs was prepped with Betadine and draped in usual sterile manner.  Using Guidant endovein harvesting system, vein was harvested from the right thigh and calf and was of excellent quality.  Median sternotomy was performed.  The left internal mammary artery was dissected down as a pedicle graft.  The distal artery was divided and had good free flow.  The pericardium was opened. Overall ventricular function appeared preserved.  The patient was systemically heparinized.  The ascending aorta was cannulated.  The aortic root vent, cardioplegia needle was introduced into the ascending aorta.  The patient was placed on cardiopulmonary bypass 2.4 liters per minute per meter square.  Sites of anastomosis were selected and dissected out at the epicardium.  The patient's body temperature was cooled to 32 degrees.  Aortic crossclamp was applied.  600 mL of cold blood potassium cardioplegia was administered antegrade.  The attention was turned first to the posterior descending coronary artery, which was very diffusely diseased and the midportion of the vessel.  The artery was opened.  The proximal half of the posterior descending was thickly calcified.  Using a running 7-0 Prolene, distal anastomosis was performed.  Attention was then turned to the second and third obtuse marginal.  The second obtuse marginal being slightly smaller than the third, vessel was opened and admitted a 1.5-mm probe.  This vessel also  was very diffusely plaqued.  Using diamond-type side-to-side anastomosis, distal anastomosis was performed.  Distal extent of the same vein was then carried to short distance to the third obtuse marginal, which was slightly larger.  This vessel was also very diffusely diseased distally. Using a running 7-0 Prolene, distal anastomosis was performed. Additional blood cardioplegia  was administered down the vein grafts intermittently.  Attention was then turned to the highest diagonal and ramus, the ramus was intramyocardial.  The diagonal vessel was opened, it was a 1.2-1.3 mm in size.  Using diamond-type, side-to-side anastomosis was carried out.  Distal extent of the same vein was then anastomosed to the ramus, which was slightly larger 1.3-1.4 mm in size.  Attention was then turned to the left anterior descending coronary artery, which was also a diffusely diseased vessel with areas of plaque throughout the course of the vessel.  Between the mid and distal third, the artery was opened.  Using a running 8-0 Prolene, left internal mammary artery was anastomosed to the left anterior descending coronary artery.  With the removal of the bulldog, there was a sluggish rise in the myocardial temperature, although the anastomosis looked fine.  The bulldog was placed back on the artery and the anastomosis was taken down into the mammary and was retrimmed and the second anastomosis was redone with a running 8-0 Prolene.  With release of the bulldog, there was prompt rise in myocardial septal temperature.  Bulldog was placed back on the mammary artery.  With crossclamp still in place, three punch aortotomies were performed.  Each of three vein grafts were anastomosed to the ascending aorta.  Air was evacuated from the grafts.  Aortic crossclamp was removed.  Total crossclamp time was 116 minutes.  The patient was spontaneously converted to a sinus rhythm.  Sites of anastomosis were inspected and free of bleeding.  He was then ventilated and weaned of cardiopulmonary bypass without difficulty remaining hemodynamically stable.  He was decannulated in usual fashion. Protamine sulfate was administered with operative field hemostatic. Atrial and ventricular pacing wires were applied.  Graft marker was applied.  The left pleural tube and a Blake mediastinal drain were left in  place.  Pericardium was reapproximated.  Sternum was closed with #6 stainless steel wire.  Fascia closed with interrupted 0 Vicryl, running 3-0 Vicryl in subcutaneous tissue, and 4-0 subcuticular stitch in the skin edges.  Dry dressings were applied.  Sponge and needle count was reported as correct at the completion of the procedure.  The patient tolerated the procedure without obvious complication and was transferred to Surgical Intensive Care Unit for further postoperative care.     Sheliah Plane, MD     EG/MEDQ  D:  07/18/2011  T:  07/18/2011  Job:  [id number]  cc:   Thereasa Solo. Little, M.D. Juanita Laster, MD  Electronically Signed by Sheliah Plane MD on 07/21/2011 12:43:33 PM

## 2011-07-21 NOTE — Consult Note (Signed)
[name], [name]            ACCOUNT NO.:  [id number]  MEDICAL RECORD NO.:  [id number]  LOCATION:  2115                         FACILITY:  [facility]  PHYSICIAN:  Sheliah Plane, MD    DATE OF BIRTH:  02/26/1971  DATE OF CONSULTATION:  07/16/2011 DATE OF DISCHARGE:                                CONSULTATION   CHIEF COMPLAINT:  Episode of chest pain.  The patient had cardiac catheterization done today, seen for evaluation of coronary artery bypass surgery.  REQUESTING PHYSICIAN:  Dr. Clarene Duke.  PRIMARY CARE PHYSICIAN:  Dr. Anthonette Legato in [facility].  HISTORY OF PRESENT ILLNESS:  The patient is a 40 year old with no previous known history of coronary artery occlusive disease who approximately 1 month ago had episode of substernal chest pain for which he self-treated with aspirin and nitroglycerin.  He notes that the pain was relieved.  Since then, he has had 2 other episodes, one that spontaneously resolved and the most recent while going to bed last night, he developed prolonged chest pain.  He took 81 mg of aspirin and nitro with some relief, but then the pain quickly returned and he went to the emergency room for further evaluation.  He was started on nitroglycerin drip and ultimately transferred to [facility], underwent cardiac catheterization today.  The patient denies any fever, chills, night sweats.  No cough.  His cardiac risk factors, he denies diabetes, is a nonsmoker, has never had a stroke.  Does have known hypertension and hypercholesterolemia.  PAST SURGICAL HISTORY:  None.  He has had no previous surgery before.  SOCIAL HISTORY:  The patient works, is a nonsmoker.  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  FAMILY HISTORY:  Significant for family history of asthma and hypertension.  He notes his mother is alive.  His father died at age 69 with cirrhosis.  He has 3 children who are healthy.  CURRENT MEDICATIONS: 1. Simvastatin 20 mg tablet. 2.  Bystolic 5 mg tablet once a day. 3. Lisinopril 20 mg a day.  REVIEW OF SYSTEMS:  The patient has positive chest pain.  Denies resting shortness of breath.  He did have diaphoresis with pain.  Denies any cough.  Denies hemoptysis.  Denies claudication.  Denies syncope.  He did have some nausea with the pain.  Denies urinary problems.  Denies blood in his stool or urine.  Denies any psychiatric history.  Other review of systems negative.  PHYSICAL EXAMINATION:  VITAL SIGNS:  The patient's blood pressure is 132/70, pulse 79.  He is afebrile, respiratory rate is 20. GENERAL:  The patient is awake, alert, resting completely, following cardiac catheterization by the right groin.  He is without chest pain currently and comfortable. LUNGS:  Clear bilaterally. CARDIAC:  Regular rate and rhythm without murmur or gallop. ABDOMEN:  Benign without palpable masses.  He has pressure dressing on the right groin from the cath site. EXTREMITIES:  He has no swelling in the lower extremities 2+ DP and PT pulses bilaterally.  He appears to have adequate vein in both lower extremities for bypass.  The patient's cardiac catheterization films are reviewed from catheterization performed today by Dr. Clarene Duke.  Left main is  open.  The LAD is totally occluded with some collateral filling from the right. The circumflex is a large dilated vessel with the 70% stenosis and the second obtuse marginal and 90% in the third obtuse marginal.  The right coronary artery has a dilated aneurysmal vessel with diffuse luminal irregularities, probable estimated at 70% proximal stenosis in the PD. LV function is preserved.  Left ventricular end diastolic pressures are 11.  IMPRESSION:  The patient with significant three-vessel coronary artery disease with a history of hyperlipidemia and no history of diabetes or smoking who presents with unstable anginal symptoms.  The risks and options of treatment of his coronary artery  disease are discussed with him.  The risks of coronary artery bypass grafting including death, infection, stroke, myocardial infarction, bleeding and blood transfusion were all discussed.  With the nature of his disease, I agree with Dr. Clarene Duke that coronary artery bypass grafting offers the best treatment for the patient's current anatomy and the patient is willing to proceed. We will plan to proceed with bypass surgery on Wednesday, July 17, 2011.     Sheliah Plane, MD     EG/MEDQ  D:  07/16/2011  T:  07/17/2011  Job:  [id number]  Electronically Signed by Sheliah Plane MD on 07/21/2011 12:43:28 PM

## 2011-07-23 LAB — HEMOGLOBINOPATHY EVALUATION
Hemoglobin Other: 0 % (ref 0.0–0.0)
Hgb A2 Quant: 2.4 % (ref 2.2–3.2)
Hgb A: 97.6 % (ref 96.8–97.8)
Hgb F Quant: 0 % (ref 0.0–2.0)
Hgb S Quant: 0 % (ref 0.0–0.0)

## 2011-07-25 NOTE — Discharge Summary (Signed)
[name], WEEKLY            ACCOUNT NO.:  [id number]  MEDICAL RECORD NO.:  [id number]  LOCATION:  2019                         FACILITY:  [facility]  PHYSICIAN:  Sheliah Plane, MD    DATE OF BIRTH:  09-04-1971  DATE OF ADMISSION:  07/16/2011 DATE OF DISCHARGE:  07/21/2011                              DISCHARGE SUMMARY   HISTORY:  The patient is a 40 year old Asian male with a history of hypertension, hyperlipidemia who presented to the [facility] with crushing substernal chest pain.  The patient was seen in approximately midnight.  The pain was 10/10 and started suddenly during the day.  It was not relieved by three sublingual nitroglycerin.  The patient was placed on a nitroglycerin drip and continued to have persistent chest pain and was transferred to [facility] for further evaluation and treatment.  On arrival, the chest pain was approximately 5/10 with no radiation.  He had some nausea and diaphoresis.  He had no vomiting.  He had no abdominal pain.  He had no dizziness.  He was felt to require admission for further evaluation and treatment.  Initial EKG showed normal sinus rhythm with a rate of 111. Significant ST depression was involving the lateral leads.  EKG 6 hours later on 40 mcg of nitro showed normal sinus rhythm with flipped T-waves on the lateral leads but the ST depression had resolved.  Chest x-ray showed mild streaky retrocardiac opacity.  This was felt to be likely atelectasis or scarring.  He was admitted with the diagnosis of unstable angina and continued on nitroglycerin as well as heparin and aspirin. Cardiology consultation was also obtained emergently.  He was seen by Dr. Allyson Sabal.  He did rule in for non-ST-segment elevation myocardial infarction.  He was felt to require cardiac catheterization which was done by Dr. Clarene Duke on July 16, 2011.  He was found to have severe LAD and circumflex disease and intermediate disease with  moderate disease in the PDA.  There was normal left ventricular systolic function.  There was no distal aortic or iliac disease.  Due to these findings, consultation was obtained with Sheliah Plane, MD who evaluated the patient and his studies and agreed with recommendations to proceed with surgical revascularization.  PROCEDURE:  On July 17, 2011, he underwent coronary artery bypass grafting x6 by Dr. Tyrone Sage.  Following grafts were placed: Left internal mammary to the LAD.  A sequential reverse saphenous vein graft to the first diagonal and ramus intermedius.  A sequential saphenous vein graft to the second and third obtuse marginal and a reverse saphenous vein graft to the posterior descending coronary.  He tolerated the procedure well and was taken to the surgical intensive care unit in stable condition.  POSTOPERATIVE HOSPITAL COURSE:  The patient has overall progressed quite nicely.  He had no difficulty being weaned from the ventilator or inotropic support.  He is neurologically intact.  He did have postoperative atrial fibrillation but was chemically cardioverted to a normal sinus rhythm with amiodarone and beta-blocker.  He has a mild volume overload but has responded well to diuretics.  He does have acute blood loss anemia but this is stabilized.  His most  recent hemoglobin and hematocrit dated July 20, 2011, is 8.3 and 26 respectively. Incisions are healing well without evidence of infection.  His oxygen has been weaned and he maintains good saturations on room air.  He is tolerating routine advancement activities using standard protocols.  All routine lines, monitors, and drainage devices have been discontinued in the standard fashion.  His overall status is felt to be stable for discharge on today's date, July 21, 2011.  MEDICATIONS ON DISCHARGE: 1. Amiodarone 400 mg twice daily for 7 days, then 400 mg daily. 2. Aspirin 325 mg tablet daily. 3. Iron sulfate  325 mg daily. 4. Folic acid 1 mg daily. 5. Metoprolol 12.5 mg b.i.d. 6. Oxycodone 5 mg one to two every 4-6 hours as needed for pain. 7. Simvastatin 20 mg daily.  INSTRUCTIONS:  The patient received written instructions in regard to medications, activity, diet, wound care and follow-up.  Followup include Dr. Clarene Duke in 2 weeks and Dr. Tyrone Sage in 3 weeks.  FINAL DIAGNOSIS:  Severe three-vessel coronary artery disease in the setting of a non-ST-segment elevation myocardial infarction.  Other diagnoses include acute blood loss anemia, expected postoperative volume overload, history of hypertension, history of hyperlipidemia, postoperative atrial fibrillation.     Rowe Clack, P.A.-C.   ______________________________ Sheliah Plane, MD    WEG/MEDQ  D:  07/21/2011  T:  07/21/2011  Job:  [id number]  cc:   Thereasa Solo. Little, M.D. Sheliah Plane, MD  Electronically Signed by Gershon Crane P.A.-C. on 07/25/2011 12:34:15 PM Electronically Signed by Sheliah Plane MD on 07/25/2011 09:37:17 PM

## 2011-08-14 DIAGNOSIS — E785 Hyperlipidemia, unspecified: Secondary | ICD-10-CM | POA: Insufficient documentation

## 2011-08-14 DIAGNOSIS — I2510 Atherosclerotic heart disease of native coronary artery without angina pectoris: Secondary | ICD-10-CM | POA: Insufficient documentation

## 2011-08-14 DIAGNOSIS — I10 Essential (primary) hypertension: Secondary | ICD-10-CM | POA: Insufficient documentation

## 2011-08-15 ENCOUNTER — Encounter: Payer: Self-pay | Admitting: Cardiothoracic Surgery

## 2011-08-15 ENCOUNTER — Ambulatory Visit (INDEPENDENT_AMBULATORY_CARE_PROVIDER_SITE_OTHER): Payer: Self-pay | Admitting: Cardiothoracic Surgery

## 2011-08-15 ENCOUNTER — Other Ambulatory Visit: Payer: Self-pay | Admitting: Cardiothoracic Surgery

## 2011-08-15 ENCOUNTER — Ambulatory Visit
Admission: RE | Admit: 2011-08-15 | Discharge: 2011-08-15 | Disposition: A | Discharge status: Home / Self Care | Payer: BC Managed Care – PPO | Source: Ambulatory Visit | Attending: Cardiothoracic Surgery | Admitting: Cardiothoracic Surgery

## 2011-08-15 VITALS — BP 135/85 | HR 72 | Resp 18 | Ht 66.0 in | Wt 155.0 lb

## 2011-08-15 DIAGNOSIS — Z951 Presence of aortocoronary bypass graft: Secondary | ICD-10-CM

## 2011-08-15 DIAGNOSIS — I219 Acute myocardial infarction, unspecified: Secondary | ICD-10-CM

## 2011-08-15 DIAGNOSIS — I2510 Atherosclerotic heart disease of native coronary artery without angina pectoris: Secondary | ICD-10-CM

## 2011-08-15 NOTE — Progress Notes (Signed)
[name] Date of Birth: [birth year]  Little, Thereasa Solo, MD [name], MD  Chief Complaint:  Chief Complaint  Patient presents with  . Routine Post Op    3 WEEK F/U WITH CXR, S/P CABG X 6   History of Present Illness:  The patient returns today after undergoing coronary artery bypass grafting on 07/16/2011. At that time he underwent:   SURGICAL PROCEDURES: Coronary artery bypass grafting x6 with left  internal mammary to left anterior descending coronary artery, sequential  reverse saphenous vein graft to the first diagonal and ramus  intermediate, sequential reverse saphenous vein graft to the second and  third obtuse marginal, reverse saphenous vein graft to the posterior  descending coronary artery with right leg endovein harvesting.  He's been making good progress postoperatively he's had no recurrent chest pain that initially precipitated his admission. He is currently taking care of his 3 children.  He's had no symptoms of recurrent atrial fibrillation since discharge.  Past Medical History  Diagnosis Date  . Hypertension   . Hyperlipidemia   . Coronary artery disease, occlusive     Past Surgical History  Procedure Date  . Coronary artery bypass grafting x6 07/17/11    [name]    History  Smoking status  . Never Smoker   Smokeless tobacco  . Never Used    History  Alcohol Use No    No Known Allergies  Current Outpatient Prescriptions  Medication Sig Dispense Refill  . aspirin 325 MG EC tablet Take 325 mg by mouth daily.        Marland Kitchen ibuprofen (ADVIL,MOTRIN) 200 MG tablet Take 200 mg by mouth every 6 (six) hours as needed.        Marland Kitchen lisinopril-hydrochlorothiazide (PRINZIDE,ZESTORETIC) 20-12.5 MG per tablet Take 1 tablet by mouth daily.        Marland Kitchen LORazepam (ATIVAN) 0.5 MG tablet Take 0.5 mg by mouth every 8 (eight) hours.        . metoprolol tartrate (LOPRESSOR) 25 MG tablet Take 25 mg by mouth 2 (two) times daily. 1/2 tablet po bid       .  oxycodone (OXY-IR) 5 MG capsule Take 5 mg by mouth every 4 (four) hours as needed.        . simvastatin (ZOCOR) 20 MG tablet Take 20 mg by mouth at bedtime.        . Tamsulosin HCl (FLOMAX) 0.4 MG CAPS Take 0.4 mg by mouth daily.           Family History  Problem Relation Age of Onset  . Hypertension    . Asthma       Physical Exam:  BP 135/85  Pulse 72  Resp 18  Ht 5\' 6"  (1.676 m)  Wt 155 lb (70.308 kg)  BMI 25.02 kg/m2  SpO2 98%  On exam he appears well in no distress. His lungs are clear bilaterally his sternum is stable and well healed. Her extremities are without pedal edema tenderness or evidence of infection. The right leg and the vein harvest site is well-healed.  Diagnostic Studies & Laboratory data:  Clinical Data: Follow-up bypass surgery.  CHEST - 2 VIEW  Comparison: Chest x-ray 07/22/2011.  Findings: The cardiac silhouette, mediastinal and hilar contours  are within normal limits. Surgical changes from triple bypass  surgery are noted. The lungs are clear. No pneumothorax. No  pleural effusion.  IMPRESSION:  No acute cardiopulmonary findings.  Original Report Authenticated By: P. Loralie Champagne, M.D.  Assessment / Plan: The patient is doing well postoperatively. He has no evidence of congestive heart failure or angina. I've encouraged him to enroll in the cardiac rehabilitation program. He plans to do this in [facility] as it is closer to his home. He has run out of amiodarone and was concerned about the cost, he's had no further atrial fibrillation so we will discontinue this. Overall am pleased with his progress I have not made a return appointment to see me but would be glad to see him at his or Dr. Darrol Poke requested anytime. The need to continue on statins was stressed to him.

## 2011-08-15 NOTE — Patient Instructions (Addendum)
Start Cardiac Rehab No lifting 3months over 25 lbs Ok to drive   CABG (Coronary Artery Bypass Grafting) Care After Recovery from any surgery will be different for everyone. Some people feel quite well after 3 or 4 weeks, while others take longer depending on their condition before surgery. These instructions are general guidelines on caring for yourself after you leave the hospital. Always follow your doctor's specific instructions.   IT MAY BE NORMAL TO:  Not have much appetite, feel nauseated by the smell of some foods or only want to eat one or two things.   Have swelling in your legs, especially if you have an incision. Keeping your legs up will help. Wear elastic or compression stockings if you have them.   Have trouble sleeping at night. Sometimes taking a pain pill before bedtime helps. You may not need as much sleep at night if you are napping during the day.   Be constipated because of changes in your diet, activity and medicines. Ask about stool softeners. Try to include more fiber, fruits and vegetables in your diet.   Feel sad or unhappy. You may be frustrated or cranky. You may have good days and bad days. Do not give up. Getting better takes time.   Have a lump at the top of your chest incision.   Experience muscle pain or tightness in your shoulders or upper back. Time and pain medicine may help this discomfort.   Have numbness to the side of your incision if an artery in your chest was used for bypass.   Need physical therapy if you have weakness or tingling in one or both of your arms.   Be confused or unable to think clearly if your CABG was done with a heart-lung machine. This usually gets better in 6 weeks or so.  MEDICINES  You should have a list of all the medicines you will be taking before you leave the hospital. Ask a nurse or pharmacist to help you.   For every medicine, the list should include:   Name.     Exact dose.     Time of day to be taken.     Any other details.      Be sure you understand this list. Keep a copy with you at all times.   Do not add or stop taking any medicine until you check with your doctor.  Medicines may have side effects. Side effects are not the same as allergies. Call the doctor who prescribed the medicine if you:  Start throwing up, have diarrhea or stomach pain.   Feel dizzy or lightheaded when you stand up.   Are confused, have trouble walking or keep dropping things.   Feel that your heart is skipping beats, or beating too fast or too slow.   Develop a rash.   Notice unusual bruising or bleeding.  CARE OF YOUR CHEST INCISION  Tell your surgeon right away if you notice clicking in your chest (sternum).   Support your chest with a pillow or your arms when you cough and take deep breaths.   Follow instructions about when you may bathe or shower.   The tapes may fall off on their own. If they do not, you may gently peel them off after 7 days.   Tell your surgeon if you notice:   Increased tenderness of your incision.   More redness or swelling.   Drainage or pus.   Only use lotions, creams or oils if approved  by your surgeon.   Protect your incision from sunlight during the first year to keep the scar from getting darker.  CARE OF YOUR LEG INCISION(S)  Follow instructions about bathing and bandage changes.   If you have staples, they have to be removed by the home health nurse or at the clinic.   Avoid crossing your legs.   Change positions every half hour.   Elevate your leg(s) when you are sitting. You may put your legs on the arm of a sofa if you are lying down.   Check your leg(s) daily for swelling. Check the incisions for redness or drainage.   If you have them, wear your elastic stockings while you are up for at least 2 weeks. Take them off at bedtime. They will help reduce swelling.  DIET  Start making changes when your appetite is back to normal.   Most doctors  advise a low fat, low salt diet to lower the risk of more heart problems. You may be given specific goals for how much sodium and fat you should consume every day.   A dietician may help you learn how to plan meals and make better choices about what you eat and drink.  WEIGHT  Weighing yourself every day is important so you know if you are retaining fluid that may make your heart or lungs have to work harder. Use the same scale each time.   Weigh your self every morning at the same time after you go to the bathroom, but before breakfast.   Your weight will be more accurate if you do not wear any clothes.   Record your weight.   Tell your doctor if you have gained 2 pounds or more overnight.  ACTIVITY Stop any activity at once if you feel short of breath, notice irregular heart beats, have chest pain or feel faint or dizzy. Tell your doctor or get help right away if the problem does not go away when you stop. Showers. You may be able to take showers after your staples and pacing wires are out. Avoid soaking in a tub or near water jets until your incisions are healed. Rest. You need a balance of rest and activity. You can rest by sitting quietly or by napping if you feel tired. Walking. You will have started by in the hospital. Walk at your own pace and increase gradually. Wear sturdy shoes, not slippers. Dress for the weather if you walk outdoors. Rehab programs usually begin 4 weeks after surgery, but with the guidance of your doctors, you may begin to exercise sooner. Climbing stairs. Unless your doctor tells you not to, go up stairs slowly and rest if you tire. Do not pull yourself up by the handrail. Use your thigh muscles to lift your legs.   Driving a car. You may ride as a passenger at any time. To allow your chest to heal properly, avoid driving, motorcycle or outdoor bicycle riding for the first 8 weeks. When traveling, get out of the car and walk around for a few minutes every 2  hours. Lifting. Avoid lifting, pushing or pulling anything heavier than 10 pounds for 6 weeks after surgery. This includes carrying children, groceries, suitcases, mowing the grass and similar activities. Do not hold your breath during any activity, especially when lifting or using the rest room. Returning to work. Check with your surgeon. People heal at different rates, but most people will be able to go back to work 6 to 12 weeks after  surgery if their job is not strenuous. Sexual. You may resume sexual relations when you feel comfortable. This is 2 to 4 weeks after discharge for most people. Ask your doctor or nurse for detailed information. EXERCISE GUIDELINES Exercise guidelines are usually determined by a stress test done about 4 weeks after surgery. Heart rate limits are generally set at 70 percent of those reached on this first stress test.  Stop any exercise if you:   Have trouble breathing.   Get leg cramps.      Chest pain (angina).   Unusual fatigue.       Modify your next exercise session if your pulse after exercise is more than 30 beats faster than your resting rate.  CALL YOUR SURGEON     If any of your incisions are red, oozing, bleeding or the edges are separated.   There is a "clicking" in your sternum when you move.    A fever of 101.5 twice in 24 hours.    You have more ankle swelling, leg pain, or pain in your calf.    For any questions about your surgery.   CALL YOUR CARDIOLOGIST   If your heart beats are irregular and/or fast.   If you are more short of breath.    You have any questions about your medicines.    Weight gain of more than 2 pounds a day.    Feeling dizzy or lightheaded when you first stand up.    With questions about home health care or agencies in your community for help or transportation.    Painful, frequent or bloody urination.    GET IMMEDIATE MEDICAL HELP IF YOU HAVE:  Angina or chest pain that goes to your jaw or arms.    Sudden shortness of breath, that does not go away with rest.   Fast or irregular heart beats and trouble breathing.   Numbness or weakness in your arms or legs.   Coughing up bright red blood.   Sudden severe headache.  Document Released: 05/24/2005 Document Re-Released: 04/24/2010 [facility] Patient Information 2011 [place], [place].

## 2011-08-18 NOTE — Consult Note (Signed)
NAMEDONIVEN, VANPATTEN            ACCOUNT NO.:  [id number]  MEDICAL RECORD NO.:  [id number]  LOCATION:  2115                         FACILITY:  [facility]  PHYSICIAN:  Nanetta Batty, M.D.   DATE OF BIRTH:  January 19, 1971  DATE OF CONSULTATION:  07/16/2011 DATE OF DISCHARGE:                                CONSULTATION   CHIEF COMPLAINT:  Chest pain.  HISTORY OF PRESENT ILLNESS:  [name] is a 40 year old male with no prior history of coronary artery disease but a history of hypertension and dyslipidemia who [facility] last evening with chest pain.  He describes a midsternal severe chest pain.  He takes nitroglycerin for this at home, although he has had no previous cardiac evaluation.  The nitroglycerin helped but his pain recurred and he came to [facility].  His initial troponin was negative.  His EKG is slightly abnormal with some ST depression in the inferolateral leads, although he appears to have some LVH as well.  He was transferred to [facility] and put on IV nitroglycerin and heparin was ordered.  His second troponin is positive at 2.36 with a CK of 207 and MB of 8.1.  He is currently painfree.  In retrospect, he admits to some exertional fatigue and chest tightness over the past month or so, especially when he was mowing his lawn.  He has had some shortness of breath but no radiation to his arms or neck.  Denies any nausea or vomiting associated with this.  His pain last night came on at rest.  PAST MEDICAL HISTORY:  Remarkable for treated hypertension and dyslipidemia.  He is in no major surgeries.  ALLERGIES:  He has no known drug allergies.  HOME MEDICATIONS: 1. Bystolic 5 mg daily. 2. Lisinopril 20 mg daily. 3. Simvastatin 20 mg daily.  SOCIAL HISTORY:  He is married, his wife is a Engineer, civil (consulting) here at [facility].  They have three children.  He is a nonsmoker.  FAMILY HISTORY:  Remarkable for hypertension but negative for  early coronary artery disease.  REVIEW OF SYSTEMS:  Essentially unremarkable except for noted above, the patient does admit to taking some nitroglycerin off and on over the past couple of weeks for chest pain.  PHYSICAL EXAMINATION:  VITAL SIGNS:  Blood pressure 142/98, pulse 94, respirations 12. GENERAL:  He is a well-developed, well-nourished male in no acute distress. HEENT:  Normocephalic, atraumatic.  Extraocular movements are intact. Sclerae are nonicteric.  Lids and conjunctivae within normal limits. NECK:  Without bruit or JVD. CHEST:  Clear to auscultation and percussion. CARDIAC:  Regular rate and rhythm without murmur, rub or gallop.  Normal S1, S2. ABDOMEN:  Nontender, nondistended.  Bowel sounds are present. EXTREMITIES:  Without edema.  Distal pulses are 3+/4.  There are no femoral bruits noted. NEURO:  Grossly intact.  He is awake, alert, oriented, and cooperative. SKIN:  Cool and dry.  LABORATORY DATA:  White count 7.9, hemoglobin 12.8, hematocrit 39.4, platelets 232, INR 0.98.  Sodium 141, potassium 3.9, BUN is 18, creatinine 1.07, initial troponin was negative, second troponin is 2.36, CK is 207 with 8.1 MB.  His  LDL is 80,  HDL 38.  Chest x-ray shows some streaky atelectasis.  His EKG now shows sinus rhythm at 78 with no acute changes.  He does have some voltage criteria for LVH.  IMPRESSION: 1. Non-ST elevation myocardial infarction. 2. Treated hypertension. 3. Treated dyslipidemia.  PLAN:  The patient is on heparin and nitroglycerin.  We will continue ACE inhibitor, statin and add aspirin.  He will be set up for diagnostic catheterization today.  He is currently complaining of a headache and pain free on 40 mcg of nitroglycerin and so will taper this now.     [name], P.A.   ______________________________ Nanetta Batty, M.D.    [name]  D:  07/16/2011  T:  07/16/2011  Job:  [id number]  Electronically Signed by Corine Shelter P.A. on  07/16/2011 04:46:30 PM Electronically Signed by Nanetta Batty M.D. on 08/18/2011 11:44:18 AM

## 2011-08-31 NOTE — H&P (Signed)
NAMECRISTAN, [name]            ACCOUNT NO.:  [id number]  MEDICAL RECORD NO.:  [id number]  LOCATION:  2115                         FACILITY:  [facility]  PHYSICIAN:  Lonia Blood, M.D.      DATE OF BIRTH:  [birth year]  DATE OF ADMISSION:  07/16/2011 DATE OF DISCHARGE:                             HISTORY & PHYSICAL   PRIMARY CARE PHYSICIAN:  He is unassigned to [place].  PRESENTING COMPLAINT:  Chest pain.  HISTORY OF PRESENT ILLNESS:  The patient is a 40 year old Asian man with history of hypertension and hyperlipidemia who presented to [facility] with crushing substernal chest pain.  The patient was seen around midnight there.  Chest pain was rated as 10/10 that started suddenly during the day.  It was not relieved by 3 sublingual nitro. The patient was placed on nitroglycerin drip and was still persistently having chest pain and was transferred in for further management.  He continued to have ongoing chest pain even on the drip.  On arrival in the hospital here, the chest pain rate was 5/10.  No radiation.  He had some nausea however and some diaphoresis.  No vomiting.  No abdominal pain.  No dizziness.  PAST MEDICAL HISTORY:  Significant for: 1. Hypertension. 2. Hyperlipidemia.  ALLERGIES:  He has no known drug allergies.  MEDICATIONS: 1. Lisinopril 20 mg daily. 2. Bystolic 5 mg daily. 3. Zocor 20 mg daily.  SOCIAL HISTORY:  The patient lives in [place], [place].  He has never smoked.  Denied any alcohol use.  No IV drug use.  No other substance use.  FAMILY HISTORY:  Significant mainly for hypertension.  Denied any history of coronary artery disease, especially early  REVIEW OF SYSTEMS:  All systems reviewed are negative except per HPI.  PHYSICAL EXAMINATION:  VITAL SIGNS:  His temperature was 99.5, blood pressure 162/111, pulse 79, respiratory rate of 20, sat is 100% on room air. GENERAL:  The patient is awake, alert, oriented, currently on  nitro drip.  He is in no acute distress. HEENT:  PERRL.  EOMI.  No significant pallor or jaundice.  No rhinorrhea. NECK:  Supple.  No visible JVD.  No lymphadenopathy. RESPIRATORY:  He has good air entry bilaterally.  No wheezes.  No rales. No crackles. CARDIOVASCULAR:  S1, S2.  No audible murmurs. ABDOMEN:  Soft, nontender with positive bowel sounds. EXTREMITIES:  No edema, cyanosis, or clubbing. SKIN:  No rashes or ulcers. MUSCULOSKELETAL:  No joint swelling or tenderness.  LABORATORY DATA:  His white count is 7.2, hemoglobin 13.7, platelet 252 with normal differential.  Sodium 139, potassium 3.6, chloride 102, CO2 of 25, glucose 136, BUN 21, creatinine 1.20 with calcium 9.1.  His point of care troponin at midnight was negative but no enzymes currently available.  Initial EKG showed normal sinus rhythm with a rate of 111, normal intervals.  Significant ST depression involving the lateral leads.  EKG 6 hours later on 40 mcg of nitro showed normal sinus rhythm with flipped T-waves on the lateral leads but ST depression has somewhat resolved.  His chest x-ray showed mild streaky retrocardiac opacity, likely atelectasis or scarring.  ASSESSMENT:  This is a 40 year old  gentleman presenting with what appears to be unstable angina at this point.  The patient has been having ongoing chest pain and is currently on high dose of nitroglycerin which is what it took to actually relieve his chest pain.  He has risk factors for coronary artery disease, mainly his high blood pressure, hyperlipidemia.  PLAN: 1. Unstable angina.  The patient will be admitted to step-down unit.     Continue on IV nitro drip and IV heparin, aspirin.  Pain control     with morphine and oxygen.  We will get Cardiology involvement     immediately. 2. Hypertension.  I will continue his Bystolic and lisinopril. 3. Hyperlipidemia.  Check fasting lipid panel and continue his Zocor. 4. We will continue to cycle the  patient's enzymes until he is seen by     Cardiology a.s.a.p. and further treatment initiated.     Lonia Blood, M.D.     Verlin Grills  D:  07/16/2011  T:  07/16/2011  Job:  [id number]  Electronically Signed by Lonia Blood M.D. on 08/31/2011 03:03:18 PM

## 2016-01-22 ENCOUNTER — Encounter: Payer: Self-pay | Admitting: Cardiovascular Disease

## 2016-01-22 ENCOUNTER — Ambulatory Visit (INDEPENDENT_AMBULATORY_CARE_PROVIDER_SITE_OTHER): Payer: 59 | Admitting: Cardiovascular Disease

## 2016-01-22 VITALS — BP 208/122 | HR 104 | Ht 66.0 in | Wt 172.4 lb

## 2016-01-22 DIAGNOSIS — R0600 Dyspnea, unspecified: Secondary | ICD-10-CM

## 2016-01-22 DIAGNOSIS — Z79899 Other long term (current) drug therapy: Secondary | ICD-10-CM | POA: Diagnosis not present

## 2016-01-22 DIAGNOSIS — I2510 Atherosclerotic heart disease of native coronary artery without angina pectoris: Secondary | ICD-10-CM

## 2016-01-22 DIAGNOSIS — I10 Essential (primary) hypertension: Secondary | ICD-10-CM

## 2016-01-22 DIAGNOSIS — R0602 Shortness of breath: Secondary | ICD-10-CM | POA: Diagnosis not present

## 2016-01-22 DIAGNOSIS — E785 Hyperlipidemia, unspecified: Secondary | ICD-10-CM | POA: Diagnosis not present

## 2016-01-22 MED ORDER — LOSARTAN POTASSIUM 100 MG PO TABS: 100.0000 mg | ORAL_TABLET | Freq: Every day | ORAL | Status: DC

## 2016-01-22 MED ORDER — CARVEDILOL 6.25 MG PO TABS: 6.2500 mg | ORAL_TABLET | Freq: Two times a day (BID) | ORAL | Status: DC

## 2016-01-22 NOTE — Patient Instructions (Addendum)
START LOSARTAN 100 MG DAILY AND CARVEDILOL 6.25 MG TWICE DAILY  Your physician recommends that you return for lab work in: fasting at [place] has requested that you have an echocardiogram. Echocardiography is a painless test that uses sound waves to create images of your heart. It provides your doctor with information about the size and shape of your heart and how well your heart's chambers and valves are working. This procedure takes approximately one hour. There are no restrictions for this procedure.  Your physician has requested that you have en exercise stress myoview. For further information please visit [email or web address]. Please follow instruction sheet, as given.  Dr. Sallyanne Kuster recommends that you schedule a follow-up appointment in: after testing is complete

## 2016-01-22 NOTE — Progress Notes (Signed)
Patient ID: [name], male   DOB: 1971/06/09, 45 y.o.   MRN: [id number]    Cardiology Office Note    Date:  01/23/2016   ID:  [name], DOB [birth year], MRN [id number]  PCP:  Kennon Holter, MD  Cardiologist:   Sanda Klein, MD   Chief Complaint  Patient presents with  . New Patient (Initial Visit)     has chest pain, occassional shortness of breath, occassional edema,has pain or cramping in legs-due to gout, no lightheadedness or dizziness, no fatigue    History of Present Illness:  [name] is a 45 y.o. male , previously a patient of Dr. Aldona Bar, lost to f/u in Cardiology Clinic since January 2013.   He presented with NSTEMI in 2012 and had severe multivessel disease: occluded LAD with R-L collaterals, occluded ramus intermedius with retrograde filling, high grade OM2 and OM3 ostial stenoses, aneurysmal RCA with 70% PDA. He underwent 6 vessel CABG (LIMA-LAD, SVG-Diag-Ramus, Q000111Q, SVG-PDA), complicated by postop atrial fibrillation.He has severe hypertension and has hyperlipidemia, but does not have diabetes and does not smoke. He had normal left ventricular systolic function.  He says that he ran out of all his meds "one month ago", but I feel that his noncompliance with meds is mor longstanding.  He describes class 2 exertional angina and occasional exertional dyspnea, but no symptoms at rest. He has occasional leg cramps at rest, but no clear intermittent claudication. No neurological complaints. No clinical arrhythmia recurrence since the early postop period.  He reports having a cough with ACEi in the past. Was then switched to another agent (ARB?) which was expensive, so he stopped it. He has a history of gout.  Past Medical History  Diagnosis Date  . Hypertension   . Hyperlipidemia   . Coronary artery disease, occlusive     Past Surgical History  Procedure Laterality Date  . Coronary artery bypass grafting x6  07/17/11    [facility]  . Coronary  artery bypass graft      Outpatient Prescriptions Prior to Visit  Medication Sig Dispense Refill  . ibuprofen (ADVIL,MOTRIN) 200 MG tablet Take 200 mg by mouth every 6 (six) hours as needed.      . simvastatin (ZOCOR) 20 MG tablet Take 20 mg by mouth at bedtime.      Marland Kitchen aspirin 325 MG EC tablet Take 325 mg by mouth daily. Reported on 01/22/2016    . lisinopril-hydrochlorothiazide (PRINZIDE,ZESTORETIC) 20-12.5 MG per tablet Take 1 tablet by mouth daily. Reported on 01/22/2016    . LORazepam (ATIVAN) 0.5 MG tablet Take 0.5 mg by mouth every 8 (eight) hours. Reported on 01/22/2016    . metoprolol tartrate (LOPRESSOR) 25 MG tablet Take 25 mg by mouth 2 (two) times daily. Reported on 01/22/2016    . oxycodone (OXY-IR) 5 MG capsule Take 5 mg by mouth every 4 (four) hours as needed. Reported on 01/22/2016    . Tamsulosin HCl (FLOMAX) 0.4 MG CAPS Take 0.4 mg by mouth daily. Reported on 01/22/2016     No facility-administered medications prior to visit.     Allergies:   Review of patient's allergies indicates no known allergies.   Social History   Social History  . Marital Status: Married    Spouse Name: N/A  . Number of Children: N/A  . Years of Education: N/A   Social History Main Topics  . Smoking status: Never Smoker   . Smokeless tobacco: Never Used  . Alcohol Use: No  . Drug Use:  No  . Sexual Activity: Yes   Other Topics Concern  . None   Social History Narrative   Pt works full time taking care of his 3 kids     Family History:  Heart disease, HTN, hyperlipidemia  ROS:   Please see the history of present illness.    ROS All other systems reviewed and are negative.   PHYSICAL EXAM:   VS:  BP 208/122 mmHg  Pulse 104  Ht 5\' 6"  (1.676 m)  Wt 78.217 kg (172 lb 7 oz)  BMI 27.85 kg/m2   GEN: Well nourished, well developed, in no acute distress HEENT: normal Neck: no JVD, carotid bruits, or masses Cardiac: RRR; Normal S1, S2, distinct S4; no murmurs, rubs,no edema  Respiratory:   clear to auscultation bilaterally, normal work of breathing GI: soft, nontender, nondistended, + BS MS: no deformity or atrophy Skin: warm and dry, no rash Neuro:  Alert and Oriented x 3, Strength and sensation are intact Psych: euthymic mood, full affect  Wt Readings from Last 3 Encounters:  01/22/16 78.217 kg (172 lb 7 oz)  08/15/11 70.308 kg (155 lb)      Studies/Labs Reviewed:   EKG:  EKG is ordered today.  The ekg ordered today demonstrates NSR, inferior Q waves, new since 2011, no acute repolarization changes  Recent Labs: No results found for requested labs within last 365 days.   Lipid Panel    Component Value Date/Time   CHOL 137 07/17/2011 0400   TRIG 290* 07/17/2011 0400   HDL 37* 07/17/2011 0400   CHOLHDL 3.7 07/17/2011 0400   VLDL 58* 07/17/2011 0400   LDLCALC 42 07/17/2011 0400    Additional studies/ records that were reviewed today include:  Old notes from Dr. Rex Kras    ASSESSMENT:    1. Coronary artery disease, occlusive with recent non ST- elevation myocardial infarction.    2. SOB (shortness of breath)   3. Hyperlipidemia   4. Essential hypertension   5. Medication management      PLAN:  In order of problems listed above:  1. CAD s/p CABG: his ECG seems to show interval inferior wall infarction. He has some exertional chest discomfort, but not clear that it is similar to prior angina. He needs a reevaluation for active coronary ischemia. Since his BP is very high, recommend Lexiscan Myoview. 2. Dyspnea: could represent systolic HF following interval reduction in LVEF, diastolic dysfunction due to severe systemic HTN or a combination of the two. Resing sinus tachycardia supports CHF, although he does not have overt signs of hypervolemia. Check echo. 3. HLP: should be on statin, LDL<100, preferably<70. Recheck. 4. HTN: severe. Restart beta blocker for BP, also as antianginal. Prefer carvedilol for better BP reduction. Resume ARB.  5. If possible,  try to avoid diuretics due to gout. Avoid ACEi due to cough with lisinopril.  Reevaluate after echo and nuclear study, will need titration of BP meds.  Medication Adjustments/Labs and Tests Ordered: Current medicines are reviewed at length with the patient today.  Concerns regarding medicines are outlined above.  Medication changes, Labs and Tests ordered today are listed in the Patient Instructions below. Patient Instructions  START LOSARTAN 100 MG DAILY AND CARVEDILOL 6.25 MG TWICE DAILY  Your physician recommends that you return for lab work in: fasting at [place] has requested that you have an echocardiogram. Echocardiography is a painless test that uses sound waves to create images of your heart. It provides your doctor with  information about the size and shape of your heart and how well your heart's chambers and valves are working. This procedure takes approximately one hour. There are no restrictions for this procedure.  Your physician has requested that you have en exercise stress myoview. For further information please visit [email or web address]. Please follow instruction sheet, as given.  Dr. Sallyanne Kuster recommends that you schedule a follow-up appointment in: after testing is complete          Mikael Spray, MD  01/23/2016 9:24 PM    [address], [place], [place]  [address] Phone: [phone]; Fax: [phone]

## 2016-01-23 ENCOUNTER — Encounter: Payer: Self-pay | Admitting: Cardiovascular Disease

## 2016-01-23 DIAGNOSIS — R0600 Dyspnea, unspecified: Secondary | ICD-10-CM | POA: Insufficient documentation

## 2016-01-23 LAB — CBC
HCT: 52.3 % — ABNORMAL HIGH (ref 39.0–52.0)
Hemoglobin: 17 g/dL (ref 13.0–17.0)
MCH: 21.4 pg — ABNORMAL LOW (ref 26.0–34.0)
MCHC: 32.5 g/dL (ref 30.0–36.0)
MCV: 65.8 fL — ABNORMAL LOW (ref 78.0–100.0)
MPV: 9.2 fL (ref 8.6–12.4)
Platelets: 263 10*3/uL (ref 150–400)
RBC: 7.95 MIL/uL — ABNORMAL HIGH (ref 4.22–5.81)
RDW: 17.6 % — ABNORMAL HIGH (ref 11.5–15.5)
WBC: 6.6 10*3/uL (ref 4.0–10.5)

## 2016-01-23 LAB — COMPREHENSIVE METABOLIC PANEL
ALT: 36 U/L (ref 9–46)
AST: 22 U/L (ref 10–40)
Albumin: 4.4 g/dL (ref 3.6–5.1)
Alkaline Phosphatase: 46 U/L (ref 40–115)
BUN: 20 mg/dL (ref 7–25)
CO2: 29 mmol/L (ref 20–31)
Calcium: 10 mg/dL (ref 8.6–10.3)
Chloride: 100 mmol/L (ref 98–110)
Creat: 1.33 mg/dL (ref 0.60–1.35)
Glucose, Bld: 110 mg/dL — ABNORMAL HIGH (ref 65–99)
Potassium: 4.2 mmol/L (ref 3.5–5.3)
Sodium: 139 mmol/L (ref 135–146)
Total Bilirubin: 0.8 mg/dL (ref 0.2–1.2)
Total Protein: 7.7 g/dL (ref 6.1–8.1)

## 2016-01-23 LAB — LIPID PANEL
Cholesterol: 260 mg/dL — ABNORMAL HIGH (ref 125–200)
HDL: 47 mg/dL (ref >=40)
LDL Cholesterol: 177 mg/dL — ABNORMAL HIGH (ref <130)
Total CHOL/HDL Ratio: 5.5 Ratio — ABNORMAL HIGH (ref <=5.0)
Triglycerides: 180 mg/dL — ABNORMAL HIGH (ref <150)
VLDL: 36 mg/dL — ABNORMAL HIGH (ref <30)

## 2016-01-24 ENCOUNTER — Telehealth: Payer: Self-pay | Admitting: *Deleted

## 2016-01-24 MED ORDER — ROSUVASTATIN CALCIUM 20 MG PO TABS: 20.0000 mg | ORAL_TABLET | Freq: Every day | ORAL | Status: DC

## 2016-01-24 NOTE — Telephone Encounter (Signed)
-----   Message from Sanda Klein, MD sent at 01/24/2016  8:00 AM EST ----- Cholesterol is very high. I recommend rosuvastatin 20 mg daily. Routine chemistries are normal, but blood sugar is borderline elevated. At risk for diabetes. He needs to limit intake of sugars and starches Hemoglobin is unusually high. Does he have sleep apnea? Red blood cell parameters suggesting may have alpha thalassemia trait.

## 2016-01-24 NOTE — Telephone Encounter (Signed)
Lab results called to patient.  Will stop simvastatin and start rosuvastatin 20 mg daily (sent to pharmacy).  Scheduled to have echo and nuclear study this month followed by a f/u appt with Dr. Loletha Grayer.  Dr. C will go over lab results in more detail during office visit. Patient voiced understanding.  States he does not have sleep apnea.

## 2016-01-31 ENCOUNTER — Telehealth (HOSPITAL_COMMUNITY): Payer: Self-pay

## 2016-01-31 NOTE — Telephone Encounter (Signed)
Encounter complete. 

## 2016-02-01 ENCOUNTER — Telehealth (HOSPITAL_COMMUNITY): Payer: Self-pay

## 2016-02-01 NOTE — Telephone Encounter (Signed)
Encounter complete. 

## 2016-02-02 ENCOUNTER — Encounter (HOSPITAL_COMMUNITY): Payer: Self-pay | Admitting: *Deleted

## 2016-02-02 ENCOUNTER — Ambulatory Visit (HOSPITAL_BASED_OUTPATIENT_CLINIC_OR_DEPARTMENT_OTHER)
Admission: RE | Admit: 2016-02-02 | Discharge: 2016-02-02 | Disposition: A | Discharge status: Home / Self Care | Payer: 59 | Source: Ambulatory Visit | Attending: Cardiology | Admitting: Cardiology

## 2016-02-02 ENCOUNTER — Ambulatory Visit (HOSPITAL_COMMUNITY)
Admission: RE | Admit: 2016-02-02 | Discharge: 2016-02-02 | Disposition: A | Discharge status: Home / Self Care | Payer: 59 | Source: Ambulatory Visit | Attending: Cardiology | Admitting: Cardiology

## 2016-02-02 DIAGNOSIS — Z79899 Other long term (current) drug therapy: Secondary | ICD-10-CM

## 2016-02-02 DIAGNOSIS — R0602 Shortness of breath: Secondary | ICD-10-CM

## 2016-02-02 DIAGNOSIS — I10 Essential (primary) hypertension: Secondary | ICD-10-CM | POA: Insufficient documentation

## 2016-02-02 DIAGNOSIS — E785 Hyperlipidemia, unspecified: Secondary | ICD-10-CM | POA: Diagnosis not present

## 2016-02-02 DIAGNOSIS — I2510 Atherosclerotic heart disease of native coronary artery without angina pectoris: Secondary | ICD-10-CM | POA: Diagnosis not present

## 2016-02-02 LAB — MYOCARDIAL PERFUSION IMAGING
LV dias vol: 90 mL (ref 62–150)
LV sys vol: 41 mL
Peak HR: 110 {beats}/min
Rest HR: 82 {beats}/min
SDS: 7
SRS: 0
SSS: 7
TID: 0.89

## 2016-02-02 MED ORDER — REGADENOSON 0.4 MG/5ML IV SOLN
0.4000 mg | Freq: Once | INTRAVENOUS | Status: AC
  Administered 2016-02-02: 0.4 mg via INTRAVENOUS

## 2016-02-02 MED ORDER — TECHNETIUM TC 99M SESTAMIBI GENERIC - CARDIOLITE
31.0000 | Freq: Once | INTRAVENOUS | Status: AC | PRN
  Administered 2016-02-02: 31 via INTRAVENOUS

## 2016-02-02 MED ORDER — TECHNETIUM TC 99M SESTAMIBI GENERIC - CARDIOLITE
10.3000 | Freq: Once | INTRAVENOUS | Status: AC | PRN
  Administered 2016-02-02: 10.3 via INTRAVENOUS

## 2016-02-02 NOTE — Progress Notes (Unsigned)
Patient ID: [name], male   DOB: 29-Mar-1971, 45 y.o.   MRN: [id number] Patient was scheduled for a Stress MPI, BP:  193/130, after 10 min. Was 183/130. The test was changed to a Lexiscan and Dr. Percival Spanish ok'd starting with the diastolic of [id number].

## 2016-02-03 LAB — ECHOCARDIOGRAM COMPLETE
Height: 66 in
Weight: 2752 oz

## 2016-02-09 ENCOUNTER — Encounter: Payer: Self-pay | Admitting: Cardiovascular Disease

## 2016-02-09 ENCOUNTER — Ambulatory Visit (INDEPENDENT_AMBULATORY_CARE_PROVIDER_SITE_OTHER): Payer: 59 | Admitting: Cardiovascular Disease

## 2016-02-09 VITALS — BP 170/118 | HR 80 | Ht 66.0 in | Wt 172.0 lb

## 2016-02-09 DIAGNOSIS — I10 Essential (primary) hypertension: Secondary | ICD-10-CM | POA: Diagnosis not present

## 2016-02-09 DIAGNOSIS — I2510 Atherosclerotic heart disease of native coronary artery without angina pectoris: Secondary | ICD-10-CM

## 2016-02-09 DIAGNOSIS — E785 Hyperlipidemia, unspecified: Secondary | ICD-10-CM | POA: Diagnosis not present

## 2016-02-09 MED ORDER — AMLODIPINE BESYLATE 5 MG PO TABS: 5.0000 mg | ORAL_TABLET | Freq: Every day | ORAL | Status: DC

## 2016-02-09 MED ORDER — CARVEDILOL 12.5 MG PO TABS: 12.5000 mg | ORAL_TABLET | Freq: Two times a day (BID) | ORAL | Status: DC

## 2016-02-09 NOTE — Patient Instructions (Signed)
Dr Sallyanne Kuster has recommended making the following medication changes: 1. INCREASE Carvedilol (Coreg) to 12.5 mg 2. START Amlodipine 5 mg - take 1 tablet by mouth daily  >>New and updated prescriptions have been sent to your pharmacy electronically  Dr Sallyanne Kuster recommends that you schedule a follow-up appointment in 1 month.  If you need a refill on your cardiac medications before your next appointment, please call your pharmacy.

## 2016-02-09 NOTE — Progress Notes (Signed)
Patient ID: [name], male   DOB: 29-Jan-1971, 45 y.o.   MRN: [id number]    Cardiology Office Note    Date:  02/09/2016   ID:  [name], DOB [birth year], MRN [id number]  PCP:   Melinda Crutch, MD  Cardiologist:   Sanda Klein, MD   Chief Complaint  Patient presents with  . Follow-up    ECHO &Stress test followup; no chest pain    History of Present Illness:  [name] is a 45 y.o. male with severe hypertension, hyperlipidemia and previous bypass surgery for multivessel coronary artery disease. He returns in follow-up after undergoing a nuclear stress test and echocardiogram both of which showed reassuring findings. There was no evidence of myocardial ischemia and he has normal left ventricular systolic function. There was mild diastolic dysfunction, but no signs of elevated filling pressure and no pulmonary hypertension. There were no serious valvular abnormalities.  After taking blood pressure medications again, he has improved. He has less dyspnea and no longer has exertional angina; however, his blood pressure remains severely elevated  He presented with NSTEMI in 2012 and had severe multivessel disease: occluded LAD with R-L collaterals, occluded ramus intermedius with retrograde filling, high grade OM2 and OM3 ostial stenoses, aneurysmal RCA with 70% PDA. He underwent 6 vessel CABG (LIMA-LAD, SVG-Diag-Ramus, Q000111Q, SVG-PDA), complicated by postop atrial fibrillation.He has severe hypertension and has hyperlipidemia, but does not have diabetes and does not smoke. He had normal left ventricular systolic function. No ischemia on nuclear stress test March 2017. History of cough with ace inhibitors, history of gout, avoiding diuretics.   Past Medical History  Diagnosis Date  . Hypertension   . Hyperlipidemia   . Coronary artery disease, occlusive     Past Surgical History  Procedure Laterality Date  . Coronary artery bypass grafting x6  07/17/11    [facility]  .  Coronary artery bypass graft      Outpatient Prescriptions Prior to Visit  Medication Sig Dispense Refill  . aspirin 81 MG tablet Take 81 mg by mouth daily.    Marland Kitchen ibuprofen (ADVIL,MOTRIN) 200 MG tablet Take 200 mg by mouth every 6 (six) hours as needed.      Marland Kitchen losartan (COZAAR) 100 MG tablet Take 1 tablet (100 mg total) by mouth daily. 90 tablet 3  . rosuvastatin (CRESTOR) 20 MG tablet Take 1 tablet (20 mg total) by mouth daily. 30 tablet 5  . carvedilol (COREG) 6.25 MG tablet Take 1 tablet (6.25 mg total) by mouth 2 (two) times daily. 180 tablet 3   No facility-administered medications prior to visit.     Allergies:   Review of patient's allergies indicates no known allergies.   Social History   Social History  . Marital Status: Married    Spouse Name: N/A  . Number of Children: N/A  . Years of Education: N/A   Social History Main Topics  . Smoking status: Never Smoker   . Smokeless tobacco: Never Used  . Alcohol Use: No  . Drug Use: No  . Sexual Activity: Yes   Other Topics Concern  . None   Social History Narrative   Pt works full time taking care of his 3 kids     Family History:  Heart disease, HTN, hyperlipidemia  ROS:   Please see the history of present illness.    ROS All other systems reviewed and are negative.   PHYSICAL EXAM:   VS:  BP 170/118 mmHg  Pulse 80  Ht 5\' 6"  (1.676  m)  Wt 78.019 kg (172 lb)  BMI 27.77 kg/m2   GEN: Well nourished, well developed, in no acute distress HEENT: normal Neck: no JVD, carotid bruits, or masses Cardiac: RRR; no murmurs, rubs, S4 present,no edema  Respiratory:  clear to auscultation bilaterally, normal work of breathing GI: soft, nontender, nondistended, + BS MS: no deformity or atrophy Skin: warm and dry, no rash Neuro:  Alert and Oriented x 3, Strength and sensation are intact Psych: euthymic mood, full affect  Wt Readings from Last 3 Encounters:  02/09/16 78.019 kg (172 lb)  02/02/16 78.019 kg (172 lb)    01/22/16 78.217 kg (172 lb 7 oz)      Studies/Labs Reviewed:   EKG:  EKG is not ordered today.   Recent Labs: 01/22/2016: ALT 36; BUN 20; Creat 1.33; Hemoglobin 17.0; Platelets 263; Potassium 4.2; Sodium 139   Lipid Panel    Component Value Date/Time   CHOL 260* 01/22/2016 0931   TRIG 180* 01/22/2016 0931   HDL 47 01/22/2016 0931   CHOLHDL 5.5* 01/22/2016 0931   VLDL 36* 01/22/2016 0931   LDLCALC 177* 01/22/2016 0931     ASSESSMENT:    1. Essential hypertension   2. Hyperlipidemia   3. Coronary artery disease s/p CABG      PLAN:  In order of problems listed above:  1. HTN: Increase carvedilol, add amlodipine 2. HLP: Recheck lipids in another couple of months 3. CAD s/p CABG: Angina resolved with better blood pressure control. No ischemia on nuclear stress test    Medication Adjustments/Labs and Tests Ordered: Current medicines are reviewed at length with the patient today.  Concerns regarding medicines are outlined above.  Medication changes, Labs and Tests ordered today are listed in the Patient Instructions below. Patient Instructions  Dr Sallyanne Kuster has recommended making the following medication changes: 1. INCREASE Carvedilol (Coreg) to 12.5 mg 2. START Amlodipine 5 mg - take 1 tablet by mouth daily  >>New and updated prescriptions have been sent to your pharmacy electronically  Dr Sallyanne Kuster recommends that you schedule a follow-up appointment in 1 month.  If you need a refill on your cardiac medications before your next appointment, please call your pharmacy.      Mikael Spray, MD  02/09/2016 10:36 AM    [facility] [place], [place], [place]  [address] Phone: [phone]; Fax: [phone]

## 2016-02-21 ENCOUNTER — Other Ambulatory Visit: Payer: Self-pay | Admitting: *Deleted

## 2016-02-21 MED ORDER — CARVEDILOL 12.5 MG PO TABS: 12.5000 mg | ORAL_TABLET | Freq: Two times a day (BID) | ORAL | Status: DC

## 2016-02-21 MED ORDER — AMLODIPINE BESYLATE 5 MG PO TABS: 5.0000 mg | ORAL_TABLET | Freq: Every day | ORAL | Status: DC

## 2016-02-21 MED ORDER — LOSARTAN POTASSIUM 100 MG PO TABS: 100.0000 mg | ORAL_TABLET | Freq: Every day | ORAL | Status: DC

## 2016-02-21 MED ORDER — ROSUVASTATIN CALCIUM 20 MG PO TABS: 20.0000 mg | ORAL_TABLET | Freq: Every day | ORAL | Status: DC

## 2016-02-21 MED FILL — ROSUVASTATIN CALCIUM 20 MG: 20 | 90 days supply | Qty: 90 | Fill #0

## 2016-02-21 MED FILL — LOSARTAN POTASSIUM 100 MG T: 100 | 90 days supply | Qty: 90 | Fill #0

## 2016-02-21 NOTE — Telephone Encounter (Signed)
meds refilled for [facility] pharmacy at patient request.

## 2016-03-12 MED FILL — AMLODIPINE BESYLATE 5 MG TA: 5 | 90 days supply | Qty: 90 | Fill #0

## 2016-03-14 ENCOUNTER — Encounter: Payer: Self-pay | Admitting: Cardiovascular Disease

## 2016-03-14 ENCOUNTER — Ambulatory Visit (INDEPENDENT_AMBULATORY_CARE_PROVIDER_SITE_OTHER): Payer: Self-pay | Admitting: Cardiovascular Disease

## 2016-03-14 VITALS — BP 124/84 | HR 76 | Ht 66.0 in | Wt 174.0 lb

## 2016-03-14 DIAGNOSIS — I10 Essential (primary) hypertension: Secondary | ICD-10-CM

## 2016-03-14 DIAGNOSIS — I2510 Atherosclerotic heart disease of native coronary artery without angina pectoris: Secondary | ICD-10-CM

## 2016-03-14 DIAGNOSIS — E785 Hyperlipidemia, unspecified: Secondary | ICD-10-CM

## 2016-03-14 DIAGNOSIS — Z79899 Other long term (current) drug therapy: Secondary | ICD-10-CM

## 2016-03-14 NOTE — Patient Instructions (Addendum)
Your physician recommends that you return for lab work fasting - CMET & lipid  Your physician wants you to follow-up in: 1 year with Dr. Sallyanne Kuster. You will receive a reminder letter in the mail two months in advance. If you don't receive a letter, please call our office to schedule the follow-up appointment.

## 2016-03-14 NOTE — Progress Notes (Signed)
Patient ID: [name], male   DOB: 11/09/1971, 45 y.o.   MRN: [id number]    Cardiology Office Note    Date:  03/15/2016   ID:  [name], DOB 1971/08/05, MRN [id number]  PCP:   Melinda Crutch, MD  Cardiologist:   Sanda Klein, MD   Chief Complaint  Patient presents with  . Follow-up    bp follow up; doing better    History of Present Illness:  [name] is a 45 y.o. male with a history of multivessel coronary artery disease for which she underwent bypass surgery in 2012. He presented with severe hypertension and hyperlipidemia due to noncompliance of medications and had exertional dyspnea. After restarting antihypertensive medications he feels much better and denies problems with dyspnea, angina, edema, focal neurological deficits or intermittent claudication. He feels much better. Both his echocardiogram and nuclear stress test showed reassuring findings, normal left ventricular systolic function, mild diastolic dysfunction with normal filling pressures.  He presented with NSTEMI in 2012 and had severe multivessel disease: occluded LAD with R-L collaterals, occluded ramus intermedius with retrograde filling, high grade OM2 and OM3 ostial stenoses, aneurysmal RCA with 70% PDA. He underwent 6 vessel CABG (LIMA-LAD, SVG-Diag-Ramus, [id number], SVG-PDA), complicated by postop atrial fibrillation.He has severe hypertension and has hyperlipidemia, but does not have diabetes and does not smoke. He had normal left ventricular systolic function. No ischemia on nuclear stress test March 2017. History of cough with ace inhibitors, history of gout, avoiding diuretics.  Past Medical History  Diagnosis Date  . Hypertension   . Hyperlipidemia   . Coronary artery disease, occlusive     Past Surgical History  Procedure Laterality Date  . Coronary artery bypass grafting x6  07/17/11    [facility]  . Coronary artery bypass graft      Current Medications: Outpatient Prescriptions Prior  to Visit  Medication Sig Dispense Refill  . amLODipine (NORVASC) 5 MG tablet Take 1 tablet (5 mg total) by mouth daily. 90 tablet 0  . aspirin 81 MG tablet Take 81 mg by mouth daily.    . carvedilol (COREG) 12.5 MG tablet Take 1 tablet (12.5 mg total) by mouth 2 (two) times daily with a meal. 180 tablet 0  . ibuprofen (ADVIL,MOTRIN) 200 MG tablet Take 200 mg by mouth every 6 (six) hours as needed.      Marland Kitchen losartan (COZAAR) 100 MG tablet Take 1 tablet (100 mg total) by mouth daily. 90 tablet 0  . rosuvastatin (CRESTOR) 20 MG tablet Take 1 tablet (20 mg total) by mouth daily. 90 tablet 0   No facility-administered medications prior to visit.     Allergies:   Review of patient's allergies indicates no known allergies.   Social History   Social History  . Marital Status: Married    Spouse Name: N/A  . Number of Children: N/A  . Years of Education: N/A   Social History Main Topics  . Smoking status: Never Smoker   . Smokeless tobacco: Never Used  . Alcohol Use: No  . Drug Use: No  . Sexual Activity: Yes   Other Topics Concern  . 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    ROS All other systems reviewed and are negative.   PHYSICAL EXAM:   VS:  BP 124/84 mmHg  Pulse 76  Ht 5\' 6"  (1.676 m)  Wt 78.926 kg (174 lb)  BMI 28.10 kg/m2  SpO2 97%   GEN: Well nourished, well developed, in no acute distress HEENT: normal Neck: no JVD, carotid bruits, or masses Cardiac: RRR; no murmurs, rubs, S4 present, no edema  Respiratory:  clear to auscultation bilaterally, normal work of breathing GI: soft, nontender, nondistended, + BS MS: no deformity or atrophy Skin: warm and dry, no rash Neuro:  Alert and Oriented x 3, Strength and sensation are intact Psych: euthymic mood, full affect  Wt Readings from Last 3 Encounters:  03/14/16 78.926 kg (174 lb)  02/09/16 78.019 kg (172 lb)  02/02/16  78.019 kg (172 lb)      Studies/Labs Reviewed:   EKG:  EKG is not ordered today.    Recent Labs: 01/22/2016: ALT 36; BUN 20; Creat 1.33; Hemoglobin 17.0; Platelets 263; Potassium 4.2; Sodium 139   Lipid Panel    Component Value Date/Time   CHOL 260* 01/22/2016 0931   TRIG 180* 01/22/2016 0931   HDL 47 01/22/2016 0931   CHOLHDL 5.5* 01/22/2016 0931   VLDL 36* 01/22/2016 0931   LDLCALC 177* 01/22/2016 0931     ASSESSMENT:    1. Coronary artery disease s/p CABG   2. Essential hypertension   3. Hyperlipidemia   4. Medication management      PLAN:  In order of problems listed above:  1. CAD: Asymptomatic once blood pressure was controlled, low risk nuclear study 2. HTN: Now well controlled. Reinforced the need for compliance with medicines and especially the need to avoid abrupt cessation of beta blockers 3. HLP: Reevaluate lipid profile in a few weeks, on medication    Medication Adjustments/Labs and Tests Ordered: Current medicines are reviewed at length with the patient today.  Concerns regarding medicines are outlined above.  Medication changes, Labs and Tests ordered today are listed in the Patient Instructions below. Patient Instructions  Your physician recommends that you return for lab work fasting - CMET & lipid  Your physician wants you to follow-up in: 1 year with Dr. Sallyanne Kuster. You will receive a reminder letter in the mail two months in advance. If you don't receive a letter, please call our office to schedule the follow-up appointment.      Mikael Spray, MD  03/15/2016 3:00 PM    [address], [place], [place]  [address] Phone: [phone]; Fax: [phone]

## 2016-03-19 MED FILL — CARVEDILOL 12.5 MG TABLET: 12.5 | 90 days supply | Qty: 180 | Fill #0

## 2016-06-06 ENCOUNTER — Other Ambulatory Visit: Payer: Self-pay | Admitting: Cardiovascular Disease

## 2016-06-06 MED FILL — LOSARTAN POTASSIUM 100 MG T: 100 | 90 days supply | Qty: 90 | Fill #0

## 2016-06-06 MED FILL — ROSUVASTATIN CALCIUM 20 MG: 20 | 90 days supply | Qty: 90 | Fill #0

## 2016-06-27 ENCOUNTER — Other Ambulatory Visit: Payer: Self-pay | Admitting: Cardiovascular Disease

## 2016-06-27 MED FILL — CARVEDILOL 12.5 MG TABLET: 12.5 | 90 days supply | Qty: 180 | Fill #0

## 2016-06-27 MED FILL — AMLODIPINE BESYLATE 5 MG TA: 5 | 90 days supply | Qty: 90 | Fill #0

## 2016-06-27 NOTE — Telephone Encounter (Signed)
Rx request sent to pharmacy.  

## 2016-10-07 ENCOUNTER — Other Ambulatory Visit: Payer: Self-pay | Admitting: Cardiovascular Disease

## 2016-10-07 MED FILL — LOSARTAN POTASSIUM 100 MG T: 100 | 90 days supply | Qty: 90 | Fill #0

## 2016-10-07 MED FILL — ROSUVASTATIN CALCIUM 20 MG: 20 | 90 days supply | Qty: 90 | Fill #0

## 2016-10-14 MED FILL — CARVEDILOL 12.5 MG TABLET: 12.5 | 90 days supply | Qty: 180 | Fill #1

## 2016-10-24 MED FILL — AMLODIPINE BESYLATE 5 MG TA: 5 | 90 days supply | Qty: 90 | Fill #1

## 2017-01-28 MED FILL — CARVEDILOL 12.5 MG TABLET: 12.5 | 90 days supply | Qty: 180 | Fill #2

## 2017-04-24 IMAGING — NM NM MISC PROCEDURE
6 series · 36 of 36 positions shown · non-contrast
Comparison: none

[Series 1: wbr_r-proj_st wbr rest · 6.40mm/px · 6 of 64 frames shown]
[frame 6/64]
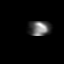
[frame 16/64]
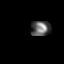
[frame 27/64]
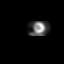
[frame 38/64]
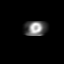
[frame 48/64]
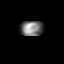
[frame 59/64]
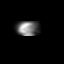

[Series 1: wbr rest · 6.40mm/px · 6 of 64 frames shown]
[frame 6/64]
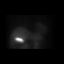
[frame 16/64]
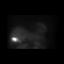
[frame 27/64]
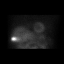
[frame 38/64]
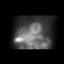
[frame 48/64]
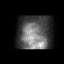
[frame 59/64]
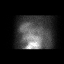

[Series 2: wbr_s-proj_st wbr stress-gsp · 6.40mm/px · 6 of 512 frames shown]
[frame 43/512]
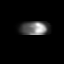
[frame 128/512]
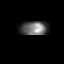
[frame 214/512]
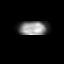
[frame 299/512]
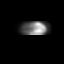
[frame 384/512]
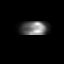
[frame 470/512]
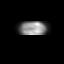

[Series 2: wbr stress-gsp · 6.40mm/px · 6 of 512 frames shown]
[frame 43/512]
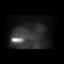
[frame 128/512]
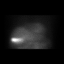
[frame 214/512]
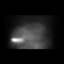
[frame 299/512]
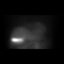
[frame 384/512]
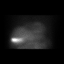
[frame 470/512]
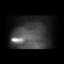

[Series 3: wbr stress-sum-em · 6.40mm/px · 6 of 64 frames shown]
[frame 6/64]
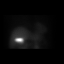
[frame 16/64]
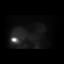
[frame 27/64]
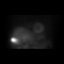
[frame 38/64]
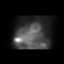
[frame 48/64]
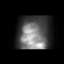
[frame 59/64]
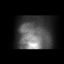

[Series 3: wbr_s-proj_st wbr stress-sum-em · 6.40mm/px · 6 of 64 frames shown]
[frame 6/64]
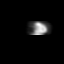
[frame 16/64]
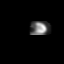
[frame 27/64]
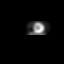
[frame 38/64]
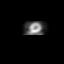
[frame 48/64]
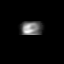
[frame 59/64]
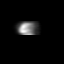

[36 of 36 positions shown; findings below may reference images not displayed]

Canned report from images found in remote index.

Refer to host system for actual result text.

## 2017-12-09 ENCOUNTER — Other Ambulatory Visit: Payer: Self-pay

## 2017-12-09 ENCOUNTER — Inpatient Hospital Stay (HOSPITAL_BASED_OUTPATIENT_CLINIC_OR_DEPARTMENT_OTHER)
Admission: EM | Admit: 2017-12-09 | Discharge: 2017-12-09 | DRG: 379 | Discharge status: Against Medical Advice | Payer: 59 | Attending: Family Medicine | Admitting: Family Medicine

## 2017-12-09 ENCOUNTER — Other Ambulatory Visit: Payer: Self-pay | Admitting: Cardiovascular Disease

## 2017-12-09 ENCOUNTER — Encounter (HOSPITAL_BASED_OUTPATIENT_CLINIC_OR_DEPARTMENT_OTHER): Payer: Self-pay | Admitting: Emergency Medicine

## 2017-12-09 DIAGNOSIS — Z951 Presence of aortocoronary bypass graft: Secondary | ICD-10-CM | POA: Diagnosis not present

## 2017-12-09 DIAGNOSIS — E785 Hyperlipidemia, unspecified: Secondary | ICD-10-CM | POA: Diagnosis present

## 2017-12-09 DIAGNOSIS — Z79899 Other long term (current) drug therapy: Secondary | ICD-10-CM

## 2017-12-09 DIAGNOSIS — I10 Essential (primary) hypertension: Secondary | ICD-10-CM | POA: Diagnosis present

## 2017-12-09 DIAGNOSIS — I2510 Atherosclerotic heart disease of native coronary artery without angina pectoris: Secondary | ICD-10-CM | POA: Diagnosis present

## 2017-12-09 DIAGNOSIS — K921 Melena: Principal | ICD-10-CM | POA: Diagnosis present

## 2017-12-09 DIAGNOSIS — Z7982 Long term (current) use of aspirin: Secondary | ICD-10-CM | POA: Diagnosis not present

## 2017-12-09 DIAGNOSIS — K922 Gastrointestinal hemorrhage, unspecified: Secondary | ICD-10-CM | POA: Diagnosis present

## 2017-12-09 LAB — CBC WITH DIFFERENTIAL/PLATELET
Basophils Absolute: 0 10*3/uL (ref 0.0–0.1)
Basophils Relative: 0 %
Eosinophils Absolute: 0.1 10*3/uL (ref 0.0–0.7)
Eosinophils Relative: 1 %
HCT: 29.6 % — ABNORMAL LOW (ref 39.0–52.0)
Hemoglobin: 9.6 g/dL — ABNORMAL LOW (ref 13.0–17.0)
Lymphocytes Relative: 33 %
Lymphs Abs: 2.4 10*3/uL (ref 0.7–4.0)
MCH: 20.6 pg — ABNORMAL LOW (ref 26.0–34.0)
MCHC: 32.4 g/dL (ref 30.0–36.0)
MCV: 63.5 fL — ABNORMAL LOW (ref 78.0–100.0)
Monocytes Absolute: 0.4 10*3/uL (ref 0.1–1.0)
Monocytes Relative: 6 %
Neutro Abs: 4.5 10*3/uL (ref 1.7–7.7)
Neutrophils Relative %: 60 %
Platelets: 251 10*3/uL (ref 150–400)
RBC: 4.66 MIL/uL (ref 4.22–5.81)
RDW: 16.1 % — ABNORMAL HIGH (ref 11.5–15.5)
WBC: 7.4 10*3/uL (ref 4.0–10.5)

## 2017-12-09 LAB — COMPREHENSIVE METABOLIC PANEL
ALT: 20 U/L (ref 17–63)
AST: 23 U/L (ref 15–41)
Albumin: 3.4 g/dL — ABNORMAL LOW (ref 3.5–5.0)
Alkaline Phosphatase: 39 U/L (ref 38–126)
Anion gap: 9 (ref 5–15)
BUN: 34 mg/dL — ABNORMAL HIGH (ref 6–20)
CO2: 24 mmol/L (ref 22–32)
Calcium: 8.8 mg/dL — ABNORMAL LOW (ref 8.9–10.3)
Chloride: 104 mmol/L (ref 101–111)
Creatinine, Ser: 1.3 mg/dL — ABNORMAL HIGH (ref 0.61–1.24)
GFR calc Af Amer: >60 mL/min (ref >60)
GFR calc non Af Amer: >60 mL/min (ref >60)
Glucose, Bld: 158 mg/dL — ABNORMAL HIGH (ref 65–99)
Potassium: 3.7 mmol/L (ref 3.5–5.1)
Sodium: 137 mmol/L (ref 135–145)
Total Bilirubin: 0.6 mg/dL (ref 0.3–1.2)
Total Protein: 6.1 g/dL — ABNORMAL LOW (ref 6.5–8.1)

## 2017-12-09 LAB — PROTIME-INR
INR: 0.97
Prothrombin Time: 12.8 seconds (ref 11.4–15.2)

## 2017-12-09 LAB — OCCULT BLOOD X 1 CARD TO LAB, STOOL: Fecal Occult Bld: POSITIVE — AB

## 2017-12-09 MED ORDER — SODIUM CHLORIDE 0.9 % IV SOLN
8.0000 mg/h | INTRAVENOUS | Status: DC
  Administered 2017-12-09: 8 mg/h via INTRAVENOUS
  Filled 2017-12-09: qty 80

## 2017-12-09 MED ORDER — PANTOPRAZOLE SODIUM 40 MG IV SOLR
INTRAVENOUS | Status: AC
  Filled 2017-12-09: qty 160

## 2017-12-09 MED ORDER — SODIUM CHLORIDE 0.9 % IV SOLN
80.0000 mg | Freq: Once | INTRAVENOUS | Status: AC
  Administered 2017-12-09: 80 mg via INTRAVENOUS
  Filled 2017-12-09: qty 80

## 2017-12-09 MED ORDER — SODIUM CHLORIDE 0.9 % IV BOLUS (SEPSIS)
1000.0000 mL | Freq: Once | INTRAVENOUS | Status: AC
  Administered 2017-12-09: 1000 mL via INTRAVENOUS

## 2017-12-09 MED ORDER — PANTOPRAZOLE SODIUM 40 MG PO TBEC: 40.0000 mg | DELAYED_RELEASE_TABLET | Freq: Every day | ORAL | 0 refills | Status: DC

## 2017-12-09 MED FILL — LOSARTAN POTASSIUM 100 MG T: 100 | 15 days supply | Qty: 15 | Fill #0

## 2017-12-09 MED FILL — CARVEDILOL 12.5 MG TABLET: 12.5 | 15 days supply | Qty: 30 | Fill #0

## 2017-12-09 MED FILL — AMLODIPINE BESYLATE 5 MG TA: 5 | 15 days supply | Qty: 15 | Fill #0

## 2017-12-09 MED FILL — PANTOPRAZOLE SOD DR 40 MG T: 40 | 20 days supply | Qty: 20 | Fill #0

## 2017-12-09 MED FILL — ROSUVASTATIN CALCIUM 20 MG: 20 | 15 days supply | Qty: 15 | Fill #0

## 2017-12-09 NOTE — ED Triage Notes (Signed)
Pt reports soft black stools since Fri; pt is on day 20 of 21 days of "fasting" by eating vegetables and fruits only

## 2017-12-09 NOTE — ED Notes (Signed)
Pt requesting to leave AMA. Sts he has a GI appt set up for tomorrow. EDP at bedside to discuss risks of leaving AMA.

## 2017-12-09 NOTE — ED Notes (Signed)
Pt stopped all medications when he started fasting.

## 2017-12-09 NOTE — ED Provider Notes (Signed)
[facility] EMERGENCY DEPARTMENT Provider Note   CSN: [id number] Arrival date & time: 12/09/17  0859     History   Chief Complaint Chief Complaint  Patient presents with  . Melena    HPI [name] is a 47 y.o. male    Patient complains of melena. Symptoms have been present for approximately 5 days. The symptoms are unchanged. This has been associated with no other symptoms.  He denies abdominal bloating, belching, chest pain, cough, early satiety, heartburn, hematemesis, nausea and abdominal pain.Marland Kitchen  He has not used nonsteroidal anti-inflammatory drugs on a regular bases; he is not anticoagulated.  The patient currently denies significant abdominal pain or discomfort.  There is not a past history of gastrointestinal bleeding.  Patient is on day 20 of a 21 day fast in which he is only eating fruits, vegetables, and wheat bread. He has been taking none of his medications including aspirin.    HPI  Past Medical History:  Diagnosis Date  . Coronary artery disease, occlusive   . Hyperlipidemia   . Hypertension     Patient Active Problem List   Diagnosis Date Noted  . Dyspnea 01/23/2016  . Hypertension   . Hyperlipidemia   . Coronary artery disease s/p CABG     Past Surgical History:  Procedure Laterality Date  . CORONARY ARTERY BYPASS GRAFT    . Coronary artery bypass grafting x6  07/17/11   [facility]       Home Medications    Prior to Admission medications   Medication Sig Start Date End Date Taking? Authorizing Provider  amLODipine (NORVASC) 5 MG tablet TAKE 1 TABLET (5 MG TOTAL) BY MOUTH DAILY. 06/27/16   Croitoru, Mihai, MD  aspirin 81 MG tablet Take 81 mg by mouth daily.    [provider]  carvedilol (COREG) 12.5 MG tablet TAKE 1 TABLET (12.5 MG TOTAL) BY MOUTH 2 (TWO) TIMES DAILY WITH A MEAL. 06/27/16   Croitoru, Mihai, MD  ibuprofen (ADVIL,MOTRIN) 200 MG tablet Take 200 mg by mouth every 6 (six) hours as needed.      [provider]  losartan (COZAAR) 100 MG tablet TAKE 1 TABLET (100 MG TOTAL) BY MOUTH DAILY. 10/07/16   Croitoru, Mihai, MD  rosuvastatin (CRESTOR) 20 MG tablet TAKE 1 TABLET (20 MG TOTAL) BY MOUTH DAILY. 10/07/16   Croitoru, Dani Gobble, MD    Family History Family History  Problem Relation Age of Onset  . Hypertension Unknown   . Asthma Unknown     Social History Social History   Tobacco Use  . Smoking status: Never Smoker  . Smokeless tobacco: Never Used  Substance Use Topics  . Alcohol use: No  . Drug use: No     Allergies   Patient has no known allergies.   Review of Systems Review of Systems Ten systems reviewed and are negative for acute change, except as noted in the HPI.    Physical Exam Updated Vital Signs BP (!) 164/87   Pulse (!) 103   Temp 98.3 F (36.8 C) (Oral)   Resp 18   Ht 5\' 6"  (1.676 m)   Wt 72.6 kg (160 lb)   SpO2 98%   BMI 25.82 kg/m   Physical Exam  Constitutional: He appears well-developed and well-nourished. No distress.  HENT:  Head: Normocephalic and atraumatic.  Eyes: Conjunctivae are normal. No scleral icterus.  Neck: Normal range of motion. Neck supple.  Cardiovascular: Regular rhythm and normal heart sounds. Tachycardia present.  Pulmonary/Chest:  Effort normal and breath sounds normal. No respiratory distress.  Abdominal: Soft. Normal appearance and bowel sounds are normal. There is no tenderness.  Genitourinary: Rectal exam shows guaiac positive stool. Rectal exam shows no mass and no tenderness.  Genitourinary Comments: Digital Rectal Exam reveals sphincter with good tone. No external hemorrhoids. No masses or fissures. Thick, melanotic stool noted on examining finger. FOBT + for blood.  Musculoskeletal: He exhibits no edema.  Neurological: He is alert.  Skin: Skin is warm and dry. He is not diaphoretic.  Psychiatric: His behavior is normal.  Nursing note and vitals reviewed.    ED Treatments / Results  Labs (all labs  ordered are listed, but only abnormal results are displayed) Labs Reviewed  COMPREHENSIVE METABOLIC PANEL - Abnormal; Notable for the following components:      Result Value   Glucose, Bld 158 (*)    BUN 34 (*)    Creatinine, Ser 1.30 (*)    Calcium 8.8 (*)    Total Protein 6.1 (*)    Albumin 3.4 (*)    All other components within normal limits  CBC WITH DIFFERENTIAL/PLATELET - Abnormal; Notable for the following components:   Hemoglobin 9.6 (*)    HCT 29.6 (*)    MCV 63.5 (*)    MCH 20.6 (*)    RDW 16.1 (*)    All other components within normal limits  OCCULT BLOOD X 1 CARD TO LAB, STOOL - Abnormal; Notable for the following components:   Fecal Occult Bld POSITIVE (*)    All other components within normal limits  PROTIME-INR  TYPE AND SCREEN    EKG  EKG Interpretation None       Radiology No results found.  Procedures .Critical Care Performed by: Margarita Mail, PA-C Authorized by: Margarita Mail, PA-C   Critical care provider statement:    Critical care time (minutes):  45   Critical care was necessary to treat or prevent imminent or life-threatening deterioration of the following conditions: GI Hemorrhage.   Critical care was time spent personally by me on the following activities:  Development of treatment plan with patient or surrogate, discussions with consultants, evaluation of patient's response to treatment, examination of patient, interpretation of cardiac output measurements, obtaining history from patient or surrogate, review of old charts, re-evaluation of patient's condition, pulse oximetry, ordering and review of laboratory studies and ordering and performing treatments and interventions   (including critical care time)  Medications Ordered in ED Medications  sodium chloride 0.9 % bolus 1,000 mL (1,000 mLs Intravenous New Bag/Given 12/09/17 0934)  pantoprazole (PROTONIX) 80 mg in sodium chloride 0.9 % 100 mL IVPB (80 mg Intravenous New Bag/Given  12/09/17 1027)  pantoprazole (PROTONIX) 80 mg in sodium chloride 0.9 % 250 mL (0.32 mg/mL) infusion (not administered)  pantoprazole (PROTONIX) 40 MG injection (not administered)     Initial Impression / Assessment and Plan / ED Course  I have reviewed the triage vital signs and the nursing notes.  Pertinent labs & imaging results that were available during my care of the patient were reviewed by me and considered in my medical decision making (see chart for details).  Clinical Course as of Dec 10 1031  Tue Dec 09, 2017  1019 Hemoglobin: (!) 9.6 [AH]  1019 Creatinine: (!) 1.30 [AH]  1022 Patient with elevated BUN, Cr at baseline.  +GI bleed with tachycardia Patient noted to be hypertensive in the ER. NO historical relationship with GI. Placed on protonix Bolus and infusion. NPO.  [  AH]  7119 47 year old male with prior history of CABG coronary disease here with 5 days of black stool not associated with any abdominal pain.  The patient is acutely anemic and has an elevated BUN and creatinine.  He was also guaiac positive.  He was mildly tachycardic on arrival but normotensive.  He is going to need admission for an upper endoscopy.  Gastroenterology is consulted.  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  We are asking him to try to reach out to friends and neighbors to see if they can care for his kids while we get him admitted to the hospital.  [MB]  1029 Dr. Fuller Plan has agrees that patient needs admission for emergent scope. . I will call the hospitalist for admission  [AH]    Clinical Course User Index [AH] Margarita Mail, PA-C [MB] Hayden Rasmussen, MD    Patient left AMA (see notes by Dr. Melina Copa who discussed risks with the patient).  Patient given High dose protonix at d/c. I discussed return precautions including any worsening in condition, sob, syncope, increased volume of melena, racing heart. Patient invited to  return at any time should he change his mind. He has a f/u with GI in the morning for emergent endoscopy.   Final Clinical Impressions(s) / ED Diagnoses   Final diagnoses:  Acute GI bleeding    ED Discharge Orders    None       Margarita Mail, PA-C 12/09/17 1711    Hayden Rasmussen, MD 12/10/17 1229

## 2017-12-09 NOTE — ED Provider Notes (Signed)
Clinical Course as of Dec 09 1138  Tue Dec 09, 2017  1019 Hemoglobin: (!) 9.6 [AH]  1019 Creatinine: (!) 1.30 [AH]  1022 Patient with elevated BUN, Cr at baseline.  +GI bleed with tachycardia Patient noted to be hypertensive in the ER. NO historical relationship with GI. Placed on protonix Bolus and infusion. NPO.  [AH]  1473 47 year old male with prior history of CABG coronary disease here with 5 days of black stool not associated with any abdominal pain.  The patient is acutely anemic and has an elevated BUN and creatinine.  He was also guaiac positive.  He was mildly tachycardic on arrival but normotensive.  He is going to need admission for an upper endoscopy.  Gastroenterology is consulted.  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  We are asking him to try to reach out to friends and neighbors to see if they can care for his kids while we get him admitted to the hospital.  [MB]  1029 Dr. Fuller Plan has agrees that patient needs admission for emergent scope. . I will call the hospitalist for admission  [AH]    Clinical Course User Index [AH] Margarita Mail, PA-C [MB] Hayden Rasmussen, MD  I was asked to see [name] again.  He wishes to be discharged and not be transferred to [facility] for endoscopy.  He has a family friend who works in the GI clinic who is going to do an endoscopy tomorrow for him.  I discussed with him that this would be [place] as he is still at high risk for complications from his anemia and potentially could have an increase in his bleeding, and potential death.   Date: 2017-12-25 Patient: [name] Admitted: 12/25/17  8:59 AM Attending Provider: Aletta Edouard, MD  [name] or his authorized caregiver has made the decision for the patient to leave the emergency department against the advice of Aletta Edouard, MD.  He or his authorized caregiver has been informed and  understands the inherent risks, including death.  He or his authorized caregiver has decided to accept the responsibility for this decision. [name] and all necessary parties have been advised that he may return for further evaluation or treatment. His condition at time of discharge was Serious.  [name] had current vital signs as follows:  Blood pressure (!) 157/100, pulse 92, temperature 98.3 F (36.8 C), temperature source Oral, resp. rate 19, height 5\' 6"  (1.676 m), weight 72.6 kg (160 lb), SpO2 99 %.   [name] or his authorized caregiver has signed the Leaving Against Medical Advice form prior to leaving the department.  Hayden Rasmussen 2017/12/25       Margarita Mail, PA-C 2017-12-25 1709    Hayden Rasmussen, MD 12/10/17 1227

## 2017-12-09 NOTE — Discharge Instructions (Signed)
Please return at any time for admission as we feel this is the safest course of care. Please take the protonix as I have prescribed it until you have your endoscopy or are instructed otherwise. Return immediately if you feel lightheaded, become dizzy, pass out or become short of breath.

## 2017-12-10 DIAGNOSIS — R195 Other fecal abnormalities: Secondary | ICD-10-CM | POA: Diagnosis not present

## 2017-12-10 DIAGNOSIS — K921 Melena: Secondary | ICD-10-CM | POA: Diagnosis not present

## 2017-12-10 DIAGNOSIS — D509 Iron deficiency anemia, unspecified: Secondary | ICD-10-CM | POA: Diagnosis not present

## 2017-12-10 MED FILL — SUPREP BOWEL PREP KIT: 17.5-3.13-1 | 1 days supply | Qty: 354 | Fill #0

## 2017-12-12 ENCOUNTER — Other Ambulatory Visit: Payer: Self-pay | Admitting: *Deleted

## 2017-12-12 NOTE — Patient Outreach (Signed)
[facility]) Care Management  12/12/2017  Stephen Wenrick 1971-03-29 [id number]   Subjective: Telephone call to patient's home number, no answer, left HIPAA compliant voicemail message, and requested call back.     Objective: Per KPN (Knowledge Performance Now, point of care tool) and chart review, patient has no recent hospitalization.  Patient has ED visit on 12/09/17 having black stool times 5 days, MD recommended hospital admission, and patient left against medical advice.  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  Patient also has a history of CABG and CAD.     Assessment: Received [facility] ED utilization follow up referral on 12/12/17.   ED follow up pending patient contact.      Plan: RNCM will call patient for 2nd telephone outreach attempt, ED utilization follow up, within 10 business days if no return call.      Jerrik Housholder H. Annia Friendly, BSN, [place] Management [facility] Telephonic CM Phone: [phone] Fax: [phone]

## 2017-12-15 ENCOUNTER — Other Ambulatory Visit: Payer: Self-pay | Admitting: *Deleted

## 2017-12-15 ENCOUNTER — Ambulatory Visit: Payer: Self-pay | Admitting: *Deleted

## 2017-12-15 NOTE — Patient Outreach (Signed)
[facility]) Care Management  12/15/2017  Nolberto Daza 1971-01-22 [id number]   Subjective: Telephone call to patient's home number, no answer, left HIPAA compliant voicemail message, and requested call back.     Objective: Per KPN (Knowledge Performance Now, point of care tool) and chart review, patient has no recent hospitalization.  Patient has ED visit on 12/09/17 having black stool times 5 days, MD recommended hospital admission, and patient left against medical advice.  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  Patient also has a history of CABG and CAD.     Assessment: Received [facility] ED utilization follow up referral on 12/12/17.   ED follow up pending patient contact.      Plan: RNCM will call patient for 3rd telephone outreach attempt, ED utilization follow up, within 10 business days if no return call.      Krista Godsil H. Annia Friendly, BSN, [place] Management [facility] Telephonic CM Phone: [phone] Fax: [phone]

## 2017-12-16 ENCOUNTER — Ambulatory Visit: Payer: Self-pay | Admitting: *Deleted

## 2017-12-16 DIAGNOSIS — K259 Gastric ulcer, unspecified as acute or chronic, without hemorrhage or perforation: Secondary | ICD-10-CM | POA: Diagnosis not present

## 2017-12-16 DIAGNOSIS — K2990 Gastroduodenitis, unspecified, without bleeding: Secondary | ICD-10-CM | POA: Diagnosis not present

## 2017-12-16 DIAGNOSIS — K2950 Unspecified chronic gastritis without bleeding: Secondary | ICD-10-CM | POA: Diagnosis not present

## 2017-12-16 DIAGNOSIS — B9681 Helicobacter pylori [H. pylori] as the cause of diseases classified elsewhere: Secondary | ICD-10-CM | POA: Diagnosis not present

## 2017-12-17 ENCOUNTER — Ambulatory Visit: Payer: Self-pay | Admitting: *Deleted

## 2017-12-17 ENCOUNTER — Encounter: Payer: Self-pay | Admitting: *Deleted

## 2017-12-17 ENCOUNTER — Other Ambulatory Visit: Payer: Self-pay | Admitting: *Deleted

## 2017-12-17 NOTE — Patient Outreach (Addendum)
[facility]) Care Management  12/17/2017  San Lohmeyer April 24, 1971 [id number]   Subjective:Telephone call to patient's home number, no answer, left HIPAA compliant voicemail message, and requested call back. Telephone call to patient's mobile number, spoke with Mr. Ralpheal Zappone, declined to verify date of birth, and address.  He  requested additional information regarding nature of the call.   Advised unable to give additional information regarding nature of call without complete HIPAA verification.   States he will call back and declined HIPAA verification.       Objective:Per KPN (Knowledge Performance Now, point of care tool) and chart review,patient has no recent hospitalization. Patient has ED visit on 12/09/17 having black stool times 5 days, MD recommended hospital admission, and patient left against medical advice. 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 Patient also has a history of CABG and CAD.     Assessment: Received [facility] ED utilization follow up referral on 12/12/17. EDfollow up pending patient contact.       Plan:RNCM will send unsuccessful outreach  letter, [facility] pamphlet, and proceed with case closure, within 10 business days if no return call.      Kharlie Bring H. Annia Friendly, BSN, [place] Management [facility] Telephonic CM Phone: [phone] Fax: [phone]

## 2017-12-24 MED FILL — metroNIDAZOLE 250 MG TABS: 250 | 14 days supply | Qty: 28 | Fill #0

## 2017-12-24 MED FILL — TETRACYCLINE 250 MG CAPSULE: 250 | 14 days supply | Qty: 42 | Fill #0

## 2017-12-31 ENCOUNTER — Other Ambulatory Visit: Payer: Self-pay | Admitting: *Deleted

## 2017-12-31 NOTE — Patient Outreach (Signed)
[facility]) Care Management  12/31/2017  [name] Sep 11, 1971 [id number]   No response from patient outreach attempts will proceed with case closure.    Objective:Per KPN (Knowledge Performance Now, point of care tool) and chart review,patient has no recent hospitalization. Patient has ED visit on 12/09/17 having black stool times 5 days, MD recommended hospital admission, and patient left against medical advice. 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 Patient also has a history of CABG and CAD.     Assessment: Received [facility] ED utilization follow up referral on 12/12/17. EDfollow up not completed due to patient unable to contact, will proceed with case closure.      Plan:RNCM will send case closure due to unable to reach request to [name] [place] [facility].      [name], BSN, [place] Management [facility] Telephonic CM Phone: [phone] Fax: [phone]

## 2018-01-22 DIAGNOSIS — M109 Gout, unspecified: Secondary | ICD-10-CM | POA: Diagnosis not present

## 2018-01-22 DIAGNOSIS — I2510 Atherosclerotic heart disease of native coronary artery without angina pectoris: Secondary | ICD-10-CM | POA: Diagnosis not present

## 2018-01-22 DIAGNOSIS — I10 Essential (primary) hypertension: Secondary | ICD-10-CM | POA: Diagnosis not present

## 2018-01-22 DIAGNOSIS — E7800 Pure hypercholesterolemia, unspecified: Secondary | ICD-10-CM | POA: Diagnosis not present

## 2018-01-22 MED FILL — CARVEDILOL 12.5 MG TABLET: 12.5 | 90 days supply | Qty: 180 | Fill #0

## 2018-01-22 MED FILL — AMLODIPINE BESYLATE 5 MG TA: 5 | 90 days supply | Qty: 90 | Fill #0

## 2018-01-22 MED FILL — LOSARTAN POTASSIUM 100 MG T: 100 | 90 days supply | Qty: 90 | Fill #0

## 2018-01-22 MED FILL — ROSUVASTATIN CALCIUM 20 MG: 20 | 90 days supply | Qty: 90 | Fill #0

## 2018-02-09 DIAGNOSIS — K2990 Gastroduodenitis, unspecified, without bleeding: Secondary | ICD-10-CM | POA: Diagnosis not present

## 2018-02-09 DIAGNOSIS — K259 Gastric ulcer, unspecified as acute or chronic, without hemorrhage or perforation: Secondary | ICD-10-CM | POA: Diagnosis not present

## 2018-05-20 DIAGNOSIS — M109 Gout, unspecified: Secondary | ICD-10-CM | POA: Diagnosis not present

## 2018-05-20 DIAGNOSIS — E7800 Pure hypercholesterolemia, unspecified: Secondary | ICD-10-CM | POA: Diagnosis not present

## 2018-05-20 DIAGNOSIS — I10 Essential (primary) hypertension: Secondary | ICD-10-CM | POA: Diagnosis not present

## 2018-05-20 DIAGNOSIS — I2510 Atherosclerotic heart disease of native coronary artery without angina pectoris: Secondary | ICD-10-CM | POA: Diagnosis not present

## 2018-05-22 MED FILL — COLCHICINE 0.6 MG TABS: 0.6 | 30 days supply | Qty: 60 | Fill #0

## 2018-05-28 MED FILL — ALLOPURINOL 100 MG TABLET: 100 | 90 days supply | Qty: 90 | Fill #0

## 2018-06-01 MED FILL — AMLODIPINE BESYLATE 5 MG TA: 5 | 90 days supply | Qty: 90 | Fill #1

## 2018-06-01 MED FILL — CARVEDILOL 12.5 MG TABLET: 12.5 | 90 days supply | Qty: 180 | Fill #1

## 2018-06-01 MED FILL — LOSARTAN POTASSIUM 100 MG T: 100 | 90 days supply | Qty: 90 | Fill #1

## 2018-06-01 MED FILL — ROSUVASTATIN CALCIUM 20 MG: 20 | 90 days supply | Qty: 90 | Fill #1

## 2018-08-24 MED FILL — ALLOPURINOL 100 MG TABLET: 100 | 90 days supply | Qty: 90 | Fill #0

## 2018-09-30 MED FILL — ROSUVASTATIN CALCIUM 20 MG: 20 | 90 days supply | Qty: 90 | Fill #2

## 2018-09-30 MED FILL — AMLODIPINE BESYLATE 5 MG TA: 5 | 90 days supply | Qty: 90 | Fill #2

## 2018-09-30 MED FILL — LOSARTAN POTASSIUM 100 MG T: 100 | 90 days supply | Qty: 90 | Fill #2

## 2018-09-30 MED FILL — CARVEDILOL 12.5 MG TABLET: 12.5 | 90 days supply | Qty: 180 | Fill #2

## 2018-10-20 MED FILL — COLCHICINE 0.6 MG TABS: 0.6 | 30 days supply | Qty: 60 | Fill #0

## 2018-12-17 MED FILL — ALLOPURINOL 100 MG TABS: 100 | 90 days supply | Qty: 90 | Fill #0

## 2019-01-27 MED FILL — LOSARTAN POTASSIUM 100 MG T: 100 | 30 days supply | Qty: 30 | Fill #0

## 2019-02-03 DIAGNOSIS — I10 Essential (primary) hypertension: Secondary | ICD-10-CM | POA: Diagnosis not present

## 2019-02-03 DIAGNOSIS — E7800 Pure hypercholesterolemia, unspecified: Secondary | ICD-10-CM | POA: Diagnosis not present

## 2019-02-03 DIAGNOSIS — I2510 Atherosclerotic heart disease of native coronary artery without angina pectoris: Secondary | ICD-10-CM | POA: Diagnosis not present

## 2019-02-03 DIAGNOSIS — M109 Gout, unspecified: Secondary | ICD-10-CM | POA: Diagnosis not present

## 2019-02-03 DIAGNOSIS — R7301 Impaired fasting glucose: Secondary | ICD-10-CM | POA: Diagnosis not present

## 2019-02-03 DIAGNOSIS — Z1211 Encounter for screening for malignant neoplasm of colon: Secondary | ICD-10-CM | POA: Diagnosis not present

## 2019-02-03 MED FILL — ROSUVASTATIN CALCIUM 20 MG: 20 | 90 days supply | Qty: 90 | Fill #0

## 2019-02-03 MED FILL — DICLOFENAC SODIUM 75 MG TAB: 75 | 30 days supply | Qty: 60 | Fill #0

## 2019-02-03 MED FILL — CARVEDILOL 25 MG TABLET: 25 | 90 days supply | Qty: 180 | Fill #0

## 2019-02-03 MED FILL — AMLODIPINE BESYLATE 5 MG TA: 5 | 90 days supply | Qty: 90 | Fill #0

## 2019-02-03 MED FILL — LOSARTAN POTASSIUM 100 MG T: 100 | 90 days supply | Qty: 90 | Fill #0

## 2019-02-09 DIAGNOSIS — E1169 Type 2 diabetes mellitus with other specified complication: Secondary | ICD-10-CM | POA: Diagnosis not present

## 2019-03-26 MED FILL — ALLOPURINOL 100 MG TABS: 100 | 90 days supply | Qty: 90 | Fill #0

## 2019-04-07 ENCOUNTER — Encounter (HOSPITAL_COMMUNITY): Payer: Self-pay

## 2019-04-07 ENCOUNTER — Ambulatory Visit (HOSPITAL_COMMUNITY)
Admission: EM | Admit: 2019-04-07 | Discharge: 2019-04-07 | Disposition: A | Discharge status: Home / Self Care | Payer: 59 | Attending: Family Medicine | Admitting: Family Medicine

## 2019-04-07 ENCOUNTER — Other Ambulatory Visit: Payer: Self-pay

## 2019-04-07 DIAGNOSIS — M542 Cervicalgia: Secondary | ICD-10-CM | POA: Diagnosis not present

## 2019-04-07 NOTE — Discharge Instructions (Addendum)
I believe that your symptoms are musculoskeletal. Your EKG was normal You can continue taking the Aleve for pain as needed Gentle stretching, heat and massage can help If your symptoms worsen or you start developing chest pain again you need to go the hospital.

## 2019-04-07 NOTE — ED Provider Notes (Signed)
[place]    CSN: [id number] Arrival date & time: 04/07/19  1439     History   Chief Complaint Chief Complaint  Patient presents with  . Appointment    [phone]  . Neck Pain    HPI [name] is a 48 y.o. male.   Patient is a 48 year old male with past medical history of CAD, hyperlipidemia, hypertension, CABG. he presents today with left-sided neck pain, chest pain.  This started today with waking up.  The neck pain is worse with movement of the neck to the left.  The chest pain has somewhat subsided.  He could feel the chest pain when taking a deep breath.  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  He took Aleve this morning for the pain which helps some.  Denies any associated cough, chest congestion, fever, shortness of breath or palpitations.   ROS per HPI      Past Medical History:  Diagnosis Date  . Coronary artery disease, occlusive   . Hyperlipidemia   . Hypertension     Patient Active Problem List   Diagnosis Date Noted  . GI bleed 12/09/2017  . Dyspnea 01/23/2016  . Hypertension   . Hyperlipidemia   . Coronary artery disease s/p CABG     Past Surgical History:  Procedure Laterality Date  . CORONARY ARTERY BYPASS GRAFT    . Coronary artery bypass grafting x6  07/17/11   [facility]       Home Medications    Prior to Admission medications   Medication Sig Start Date End Date Taking? Authorizing Provider  amLODipine (NORVASC) 5 MG tablet TAKE 1 TABLET (5 MG TOTAL) BY MOUTH DAILY. 12/09/17   Croitoru, Mihai, MD  aspirin 81 MG tablet Take 81 mg by mouth daily.    [provider]  carvedilol (COREG) 12.5 MG tablet TAKE 1 TABLET (12.5 MG TOTAL) BY MOUTH 2 (TWO) TIMES DAILY WITH A MEAL. 12/09/17   Croitoru, Mihai, MD  ibuprofen (ADVIL,MOTRIN) 200 MG tablet Take 200 mg by mouth every 6 (six) hours as needed.      [provider]  losartan (COZAAR) 100 MG  tablet TAKE 1 TABLET (100 MG TOTAL) BY MOUTH DAILY. 12/09/17   Croitoru, Mihai, MD  pantoprazole (PROTONIX) 40 MG tablet Take 1 tablet (40 mg total) by mouth daily. 12/09/17   Harris, Vernie Shanks, PA-C  rosuvastatin (CRESTOR) 20 MG tablet TAKE 1 TABLET (20 MG TOTAL) BY MOUTH DAILY. 12/09/17   Croitoru, Dani Gobble, MD    Family History Family History  Problem Relation Age of Onset  . Hypertension Other   . Asthma Other     Social History Social History   Tobacco Use  . Smoking status: Never Smoker  . Smokeless tobacco: Never Used  Substance Use Topics  . Alcohol use: No  . Drug use: No     Allergies   Patient has no known allergies.   Review of Systems Review of Systems   Physical Exam Triage Vital Signs ED Triage Vitals  Enc Vitals Group     BP 04/07/19 1500 (!) 151/104     Pulse Rate 04/07/19 1500 82     Resp 04/07/19 1500 18     Temp 04/07/19 1500 97.8 F (36.6 C)     Temp Source 04/07/19 1500 Oral     SpO2 04/07/19 1500 100 %     Weight 04/07/19 1457 165  lb (74.8 kg)     Height --      Head Circumference --      Peak Flow --      Pain Score --      Pain Loc --      Pain Edu? --      Excl. in [place]? --    No data found.  Updated Vital Signs BP (!) 151/104 (BP Location: Right Arm)   Pulse 82   Temp 97.8 F (36.6 C) (Oral)   Resp 18   Wt 165 lb (74.8 kg)   SpO2 100%   BMI 26.63 kg/m   Visual Acuity Right Eye Distance:   Left Eye Distance:   Bilateral Distance:    Right Eye Near:   Left Eye Near:    Bilateral Near:     Physical Exam Vitals signs and nursing note reviewed.  Constitutional:      Appearance: Normal appearance.  HENT:     Head: Normocephalic and atraumatic.     Nose: Nose normal.  Eyes:     Conjunctiva/sclera: Conjunctivae normal.  Neck:     Musculoskeletal: Normal range of motion. No muscular tenderness.     Comments: Mildly tender with rotation of neck to the left.  No bony tenderness.  No bruising, swelling or deformities.  Nontender to palpation of cervical paravertebral muscles. Cardiovascular:     Rate and Rhythm: Normal rate and regular rhythm.  Pulmonary:     Effort: Pulmonary effort is normal.     Breath sounds: Normal breath sounds.  Musculoskeletal: Normal range of motion.  Lymphadenopathy:     Cervical: No cervical adenopathy.  Skin:    General: Skin is warm and dry.  Neurological:     Mental Status: He is alert.  Psychiatric:        Mood and Affect: Mood normal.      UC Treatments / Results  Labs (all labs ordered are listed, but only abnormal results are displayed) Labs Reviewed - No data to display  EKG None  Radiology No results found.  Procedures Procedures (including critical care time)  Medications Ordered in UC Medications - No data to display  Initial Impression / Assessment and Plan / UC Course  I have reviewed the triage vital signs and the nursing notes.  Pertinent labs & imaging results that were available during my care of the patient were reviewed by me and considered in my medical decision making (see chart for details).    Neck pain  EKG with normal sinus rhythm and normal rate. Symptoms are most likely muscle related We will have him do Aleve as needed Heat, massage and gentle stretching Instructed that if his symptoms continue, worsen or he starts developing more chest pain he will need to go to the ER. Patient understand and agree. Final Clinical Impressions(s) / UC Diagnoses   Final diagnoses:  Neck pain     Discharge Instructions     I believe that your symptoms are musculoskeletal. Your EKG was normal You can continue taking the Aleve for pain as needed Gentle stretching, heat and massage can help If your symptoms worsen or you start developing chest pain again you need to go the hospital.    ED Prescriptions    None     Controlled Substance Prescriptions [place] Controlled Substance Registry consulted? Not Applicable   Orvan July,  NP 04/07/19 [phone]

## 2019-04-07 NOTE — ED Triage Notes (Signed)
Pt states he has neck pain and when he takes a deep breathe he felts a SOB. This states this started today.

## 2019-05-19 MED FILL — LOSARTAN POTASSIUM 100 MG T: 100 | 90 days supply | Qty: 90 | Fill #0

## 2019-05-26 MED FILL — AMLODIPINE BESYLATE 5 MG TA: 5 | 90 days supply | Qty: 90 | Fill #0

## 2019-05-26 MED FILL — CARVEDILOL 25 MG TABLET: 25 | 90 days supply | Qty: 180 | Fill #0

## 2019-05-26 MED FILL — ROSUVASTATIN CALCIUM 20 MG: 20 | 90 days supply | Qty: 90 | Fill #0

## 2019-05-27 DIAGNOSIS — E1169 Type 2 diabetes mellitus with other specified complication: Secondary | ICD-10-CM | POA: Diagnosis not present

## 2019-06-21 DIAGNOSIS — E1169 Type 2 diabetes mellitus with other specified complication: Secondary | ICD-10-CM | POA: Diagnosis not present

## 2019-07-01 MED FILL — ALLOPURINOL 100 MG TABS: 100 | 90 days supply | Qty: 90 | Fill #0

## 2019-07-02 MED FILL — JARDIANCE 10 MG TABLET: 10 | 30 days supply | Qty: 30 | Fill #0

## 2019-07-02 MED FILL — DICLOFENAC SODIUM 75 MG TAB: 75 | 30 days supply | Qty: 60 | Fill #1

## 2019-08-04 MED FILL — JARDIANCE 10 MG TABLET: 10 | 30 days supply | Qty: 30 | Fill #1

## 2019-09-07 MED FILL — ROSUVASTATIN CALCIUM 20 MG: 20 | 90 days supply | Qty: 90 | Fill #1

## 2019-09-07 MED FILL — CARVEDILOL 25 MG TABLET: 25 | 90 days supply | Qty: 180 | Fill #1

## 2019-09-07 MED FILL — AMLODIPINE BESYLATE 5 MG TA: 5 | 90 days supply | Qty: 90 | Fill #1

## 2019-09-07 MED FILL — JARDIANCE 10 MG TABLET: 10 | 30 days supply | Qty: 30 | Fill #2

## 2019-10-11 MED FILL — LOSARTAN POTASSIUM 100 MG T: 100 | 90 days supply | Qty: 90 | Fill #1

## 2019-10-11 MED FILL — JARDIANCE 10 MG TABLET: 10 | 30 days supply | Qty: 30 | Fill #3

## 2019-12-21 MED FILL — AMLODIPINE BESYLATE 5 MG TA: 5 | 90 days supply | Qty: 90 | Fill #2

## 2019-12-21 MED FILL — ROSUVASTATIN CALCIUM 20 MG: 20 | 90 days supply | Qty: 90 | Fill #2

## 2019-12-21 MED FILL — CARVEDILOL 25 MG TABLET: 25 | 90 days supply | Qty: 180 | Fill #2

## 2020-01-27 MED FILL — LOSARTAN POTASSIUM 100 MG T: 100 | 90 days supply | Qty: 90 | Fill #2

## 2020-02-28 ENCOUNTER — Other Ambulatory Visit (HOSPITAL_BASED_OUTPATIENT_CLINIC_OR_DEPARTMENT_OTHER): Payer: Self-pay | Admitting: Family Medicine

## 2020-02-28 DIAGNOSIS — M109 Gout, unspecified: Secondary | ICD-10-CM | POA: Diagnosis not present

## 2020-02-28 DIAGNOSIS — E7800 Pure hypercholesterolemia, unspecified: Secondary | ICD-10-CM | POA: Diagnosis not present

## 2020-02-28 DIAGNOSIS — E1169 Type 2 diabetes mellitus with other specified complication: Secondary | ICD-10-CM | POA: Diagnosis not present

## 2020-02-28 DIAGNOSIS — Z1211 Encounter for screening for malignant neoplasm of colon: Secondary | ICD-10-CM | POA: Diagnosis not present

## 2020-02-28 DIAGNOSIS — I10 Essential (primary) hypertension: Secondary | ICD-10-CM | POA: Diagnosis not present

## 2020-02-28 MED FILL — JARDIANCE 10 MG TABLET: 10 | 90 days supply | Qty: 90 | Fill #0

## 2020-02-28 MED FILL — ALLOPURINOL 100 MG TABS: 100 | 90 days supply | Qty: 90 | Fill #0

## 2020-02-28 MED FILL — DICLOFENAC SODIUM 75 MG TAB: 75 | 30 days supply | Qty: 60 | Fill #0

## 2020-03-02 MED FILL — CARVEDILOL 25 MG TABLET: 25 | 90 days supply | Qty: 180 | Fill #0

## 2020-03-02 MED FILL — ROSUVASTATIN CALCIUM 20 MG: 20 | 90 days supply | Qty: 90 | Fill #0

## 2020-04-05 MED FILL — CARVEDILOL 25 MG TABLET: 25 | 90 days supply | Qty: 180 | Fill #3

## 2020-04-05 MED FILL — ROSUVASTATIN CALCIUM 20 MG: 20 | 90 days supply | Qty: 90 | Fill #3

## 2020-04-05 MED FILL — AMLODIPINE BESYLATE 5 MG TA: 5 | 90 days supply | Qty: 90 | Fill #3

## 2020-06-26 MED FILL — ALLOPURINOL 100 MG TABS: 100 | 90 days supply | Qty: 90 | Fill #1

## 2020-06-26 MED FILL — LOSARTAN POTASSIUM 100 MG T: 100 | 90 days supply | Qty: 90 | Fill #0

## 2020-06-26 MED FILL — JARDIANCE 10 MG TABLET: 10 | 90 days supply | Qty: 90 | Fill #1

## 2020-07-06 DIAGNOSIS — M109 Gout, unspecified: Secondary | ICD-10-CM | POA: Diagnosis not present

## 2020-07-06 DIAGNOSIS — E7800 Pure hypercholesterolemia, unspecified: Secondary | ICD-10-CM | POA: Diagnosis not present

## 2020-07-06 DIAGNOSIS — Z1211 Encounter for screening for malignant neoplasm of colon: Secondary | ICD-10-CM | POA: Diagnosis not present

## 2020-07-06 DIAGNOSIS — I10 Essential (primary) hypertension: Secondary | ICD-10-CM | POA: Diagnosis not present

## 2020-07-06 DIAGNOSIS — E1169 Type 2 diabetes mellitus with other specified complication: Secondary | ICD-10-CM | POA: Diagnosis not present

## 2020-07-21 MED FILL — ROSUVASTATIN CALCIUM 20 MG: 20 | 90 days supply | Qty: 90 | Fill #0

## 2020-07-21 MED FILL — CARVEDILOL 25 MG TABLET: 25 | 90 days supply | Qty: 180 | Fill #0

## 2020-08-01 MED FILL — AMLODIPINE BESYLATE 5 MG TA: 5 | 90 days supply | Qty: 90 | Fill #0

## 2020-10-09 ENCOUNTER — Ambulatory Visit: Payer: 59 | Attending: Internal Medicine

## 2020-10-09 ENCOUNTER — Other Ambulatory Visit (HOSPITAL_BASED_OUTPATIENT_CLINIC_OR_DEPARTMENT_OTHER): Payer: Self-pay | Admitting: Internal Medicine

## 2020-10-09 DIAGNOSIS — Z23 Encounter for immunization: Secondary | ICD-10-CM

## 2020-10-09 MED FILL — PFIZER-BIONTECH COVID-19 VA: 30 | 1 days supply | Qty: 0 | Fill #0

## 2020-10-09 MED FILL — LOSARTAN POTASSIUM 100 MG T: 100 | 90 days supply | Qty: 90 | Fill #1

## 2020-10-09 MED FILL — ALLOPURINOL 100 MG TABS: 100 | 90 days supply | Qty: 90 | Fill #2

## 2020-10-09 NOTE — Progress Notes (Signed)
   Covid-19 Vaccination Clinic  Name:  Windle Huebert    MRN: [id number] DOB: 1971/03/23  10/09/2020  Mr. Dinning was observed post Covid-19 immunization for 15 minutes without incident. He was provided with Vaccine Information Sheet and instruction to access the V-Safe system.   Mr. Kronick was instructed to call 911 with any severe reactions post vaccine: Marland Kitchen Difficulty breathing  . Swelling of face and throat  . A fast heartbeat  . A bad rash all over body  . Dizziness and weakness   Immunizations Administered    Name Date Dose VIS Date Route   Pfizer COVID-19 Vaccine 10/09/2020  2:36 PM 0.3 mL 09/06/2020 Intramuscular   Manufacturer: [place]   Lot: [id number]   [place]: [id number]     Vaccine given by Beckey Rutter, pharmacy student

## 2020-10-16 MED FILL — JARDIANCE 10 MG TABLET: 10 | 90 days supply | Qty: 90 | Fill #2

## 2020-11-22 MED FILL — CARVEDILOL 25 MG TABLET: 25 | 90 days supply | Qty: 180 | Fill #1

## 2020-11-22 MED FILL — ROSUVASTATIN CALCIUM 20 MG: 20 | 90 days supply | Qty: 90 | Fill #1

## 2020-11-22 MED FILL — AMLODIPINE BESYLATE 5 MG TA: 5 | 90 days supply | Qty: 90 | Fill #1

## 2021-01-18 MED FILL — JARDIANCE 10 MG TABLET: 10 | 90 days supply | Qty: 90 | Fill #3

## 2021-01-24 MED FILL — ALLOPURINOL 100 MG TABS: 100 | 90 days supply | Qty: 90 | Fill #3

## 2021-03-02 ENCOUNTER — Telehealth: Payer: Self-pay | Admitting: Cardiovascular Disease

## 2021-03-02 NOTE — Telephone Encounter (Signed)
Pt c/o of Chest Pain: STAT if CP now or developed within 24 hours  1. Are you having CP right now? No  2. Are you experiencing any other symptoms (ex. SOB, nausea, vomiting, sweating)? no  3. How long have you been experiencing CP? 3 days  4. Is your CP continuous or coming and going? Coming and going  5. Have you taken Nitroglycerin? No  ?

## 2021-03-02 NOTE — Telephone Encounter (Signed)
Spoke to patient states he has discomfort off and on. Patient states his blood pressure also fluctuate with blood pressure  130/90 to 96/86 .  Patient states he is working out in the garden lately.  Informed to keep hydrated.  - keep blood pressure up    If symptoms become worse  Call 911 go to closes ER instructed patient to start taking Amlodipine and carvedilol in the morning and Losartan and carvedilol   monitor blood pressure and keep reading and bring to visit

## 2021-03-05 ENCOUNTER — Other Ambulatory Visit (HOSPITAL_COMMUNITY): Payer: Self-pay

## 2021-03-12 ENCOUNTER — Other Ambulatory Visit (HOSPITAL_BASED_OUTPATIENT_CLINIC_OR_DEPARTMENT_OTHER): Payer: Self-pay

## 2021-03-12 MED ORDER — ROSUVASTATIN CALCIUM 20 MG PO TABS
ORAL_TABLET | ORAL | 0 refills | Status: DC
  Filled 2021-03-12: qty 90, 90d supply, fill #0

## 2021-03-12 MED ORDER — AMLODIPINE BESYLATE 5 MG PO TABS
ORAL_TABLET | ORAL | 0 refills | Status: DC
  Filled 2021-03-12: qty 90, 90d supply, fill #0

## 2021-03-13 ENCOUNTER — Other Ambulatory Visit (HOSPITAL_BASED_OUTPATIENT_CLINIC_OR_DEPARTMENT_OTHER): Payer: Self-pay

## 2021-03-23 ENCOUNTER — Other Ambulatory Visit: Payer: Self-pay

## 2021-03-23 ENCOUNTER — Ambulatory Visit: Payer: 59 | Admitting: Cardiovascular Disease

## 2021-03-23 ENCOUNTER — Encounter: Payer: Self-pay | Admitting: Cardiovascular Disease

## 2021-03-23 VITALS — BP 131/81 | HR 71 | Ht 66.0 in | Wt 168.2 lb

## 2021-03-23 DIAGNOSIS — E782 Mixed hyperlipidemia: Secondary | ICD-10-CM

## 2021-03-23 DIAGNOSIS — Z8739 Personal history of other diseases of the musculoskeletal system and connective tissue: Secondary | ICD-10-CM

## 2021-03-23 DIAGNOSIS — I25118 Atherosclerotic heart disease of native coronary artery with other forms of angina pectoris: Secondary | ICD-10-CM

## 2021-03-23 DIAGNOSIS — R809 Proteinuria, unspecified: Secondary | ICD-10-CM | POA: Diagnosis not present

## 2021-03-23 DIAGNOSIS — I10 Essential (primary) hypertension: Secondary | ICD-10-CM | POA: Diagnosis not present

## 2021-03-23 DIAGNOSIS — E1129 Type 2 diabetes mellitus with other diabetic kidney complication: Secondary | ICD-10-CM

## 2021-03-23 DIAGNOSIS — E785 Hyperlipidemia, unspecified: Secondary | ICD-10-CM

## 2021-03-23 NOTE — Patient Instructions (Signed)

## 2021-03-23 NOTE — Progress Notes (Signed)
Patient ID: [name], male   DOB: 09-Sep-1971, 50 y.o.   MRN: [id number]    Cardiology Office Note    Date:  03/30/2021   ID:  [name], DOB 09/05/1971, MRN [id number]  PCP:  Lawerance Cruel, MD  Cardiologist:   Sanda Klein, MD   Chief Complaint  Patient presents with  . Coronary Artery Disease    History of Present Illness:  [name] is a 50 y.o. male with a history of multivessel coronary artery disease for which she underwent bypass surgery in 2012. He presented with severe hypertension and hyperlipidemia due to noncompliance of medications and had exertional dyspnea.  He has preserved left ventricular systolic function.  This is my first meeting with As[place] since 2017.  He has been seeing Dr. Harrington Challenger on a regular basis.  He has been essentially asymptomatic over the last 5 years, until about 2 weeks ago.  He had a period of about 5 days when he had a sensation of something "hard" in his chest and his blood pressure temporarily dropped down to the 90s.  The problems resolved spontaneously.  His typical blood pressure at home is 120-130/80.  He does not recall whether he had similar symptoms prior to his bypass surgery in 2012.  Subsequently, his complaints have resolved and its unclear why he is better.  He denies exertional dyspnea or angina at this time.  He does not have edema, orthopnea or PND.  He has not had palpitations, dizziness or syncope or any focal neurological complaints.  He denies claudication or erectile dysfunction.  In 2019 he had abdominal pain and anemia and an EGD confirmed gastric ulcer, so has been off aspirin since then.  I think he did receive treatment for H. pylori.  He is currently asymptomatic from this point of view.  He has a history of gout, but without recent acute attacks.  He has diabetes mellitus treated with Jardiance and his recent hemoglobin A1c was 7.4%.  He is on chronic statin therapy and his most recent LDL was excellent  at 56.  He presented with NSTEMI in 2012 and had severe multivessel disease: occluded LAD with R-L collaterals, occluded ramus intermedius with retrograde filling, high grade OM2 and OM3 ostial stenoses, aneurysmal RCA with 70% PDA. He underwent 6 vessel CABG (LIMA-LAD, SVG-Diag-Ramus, NWG-NF6-OZ3, SVG-PDA), complicated by postop atrial fibrillation.He has severe hypertension and has hyperlipidemia, but does not have diabetes and does not smoke. He had normal left ventricular systolic function. No ischemia on nuclear stress test March 2017. History of cough with ace inhibitors, history of gout, avoiding diuretics.  Past Medical History:  Diagnosis Date  . Coronary artery disease, occlusive   . Hyperlipidemia   . Hypertension     Past Surgical History:  Procedure Laterality Date  . CORONARY ARTERY BYPASS GRAFT    . Coronary artery bypass grafting x6  07/17/11   [name]    Current Medications: Outpatient Medications Prior to Visit  Medication Sig Dispense Refill  . allopurinol (ZYLOPRIM) 100 MG tablet TAKE 1 TABLET BY MOUTH ONCE DAILY 90 tablet 4  . amLODipine (NORVASC) 5 MG tablet TAKE 1 TABLET BY MOUTH ONCE A DAY 90 tablet 4  . aspirin 81 MG tablet Take 81 mg by mouth daily.    . carvedilol (COREG) 12.5 MG tablet TAKE 1 TABLET (12.5 MG TOTAL) BY MOUTH 2 (TWO) TIMES DAILY WITH A MEAL. 30 tablet 0  . COVID-19 mRNA vaccine, Pfizer, 30 MCG/0.3ML injection AS DIRECTED .3 mL  0  . empagliflozin (JARDIANCE) 10 MG TABS tablet Take by mouth daily.    Marland Kitchen ibuprofen (ADVIL,MOTRIN) 200 MG tablet Take 200 mg by mouth every 6 (six) hours as needed.    Marland Kitchen losartan (COZAAR) 100 MG tablet TAKE 1 TABLET BY MOUTH ONCE DAILY 90 tablet 4  . rosuvastatin (CRESTOR) 20 MG tablet TAKE 1 TABLET BY MOUTH ONCE DAILY 90 tablet 4  . amLODipine (NORVASC) 5 MG tablet TAKE 1 TABLET (5 MG TOTAL) BY MOUTH DAILY. 15 tablet 0  . amLODipine (NORVASC) 5 MG tablet take 1 tablet by mouth everyday *patient needs appt* 90 tablet 0   . losartan (COZAAR) 100 MG tablet TAKE 1 TABLET (100 MG TOTAL) BY MOUTH DAILY. 15 tablet 0  . rosuvastatin (CRESTOR) 20 MG tablet TAKE 1 TABLET (20 MG TOTAL) BY MOUTH DAILY. 15 tablet 0  . rosuvastatin (CRESTOR) 20 MG tablet take 1 tablet by mouth once a day *pt need appt* 90 tablet 0  . carvedilol (COREG) 25 MG tablet TAKE 1 TABLET BY MOUTH TWICE DAILY 180 tablet 4  . pantoprazole (PROTONIX) 40 MG tablet Take 1 tablet (40 mg total) by mouth daily. 20 tablet 0   No facility-administered medications prior to visit.     Allergies:   Patient has no known allergies.   Social History   Socioeconomic History  . Marital status: Married    Spouse name: Not on file  . Number of children: Not on file  . Years of education: Not on file  . Highest education level: Not on file  Occupational History  . Not on file  Tobacco Use  . Smoking status: Never Smoker  . Smokeless tobacco: Never Used  Substance and Sexual Activity  . Alcohol use: No  . Drug use: No  . Sexual activity: Yes  Other Topics Concern  . 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    ROS All other systems are reviewed and are negative.    PHYSICAL EXAM:   VS:  BP 131/81 (BP Location: Left Arm, Patient Position: Sitting, Cuff Size: Normal)   Pulse 71   Ht 5\' 6"  (1.676 m)   Wt 168 lb 3.2 oz (76.3 kg)   BMI 27.15 kg/m     General: Alert, oriented x3, no distress, overweight Head: no evidence of trauma, PERRL, EOMI, no exophtalmos or lid lag, no myxedema, no xanthelasma; normal ears, nose and oropharynx Neck: normal jugular venous pulsations and no hepatojugular reflux; brisk carotid pulses without  delay and no carotid bruits Chest: clear to auscultation, no signs of consolidation by percussion or palpation, normal fremitus, symmetrical and full respiratory excursions Cardiovascular: normal position and quality of the apical impulse, regular rhythm, normal first and second heart sounds, no murmurs, rubs.  S4 gallop is present Abdomen: no tenderness or distention, no masses by palpation, no abnormal pulsatility or arterial bruits, normal bowel sounds, no hepatosplenomegaly Extremities: no clubbing, cyanosis or edema; 2+ radial, ulnar and brachial pulses bilaterally; 2+ right femoral, posterior tibial and dorsalis pedis pulses; 2+ left femoral, posterior tibial and dorsalis pedis pulses; no subclavian or femoral  bruits Neurological: grossly nonfocal Psych: Normal mood and affect   Wt Readings from Last 3 Encounters:  03/23/21 168 lb 3.2 oz (76.3 kg)  04/07/19 165 lb (74.8 kg)  12/09/17 160 lb (72.6 kg)      Studies/Labs Reviewed:   EKG:  EKG is ordered today shows normal sinus rhythm, left atrial abnormality, left axis deviation (an old abnormality).  Recent Labs: No results found for requested labs within last 8760 hours.   Lipid Panel    Component Value Date/Time   CHOL 260 (H) 01/22/2016 0931   TRIG 180 (H) 01/22/2016 0931   HDL 47 01/22/2016 0931   CHOLHDL 5.5 (H) 01/22/2016 0931   VLDL 36 (H) 01/22/2016 0931   LDLCALC 177 (H) 01/22/2016 0931   07/06/2020 Cholesterol 134, HDL 41, LDL 56, triglycerides 228 Hemoglobin A1c 7.4%, creatinine 1.16, potassium 4.6, normal liver function test  ASSESSMENT:    1. Coronary artery disease involving native coronary artery of native heart with other form of angina pectoris ([place])   2. Essential hypertension   3. Hyperlipidemia, unspecified hyperlipidemia type   4. Controlled type 2 diabetes mellitus with microalbuminuria, without long-term current use of insulin ([place])   5. Mixed hyperlipidemia   6. History of gout      PLAN:   In order of problems listed above:  1. CAD: Its been about 10 years since his bypass surgery and is recently had some chest discomfort.  However, the symptoms have resolved spontaneously.  I recommended a nuclear stress test, but he is concerned about the high co-pay.  We talked about this for a while and decided to go ahead with a stress test if his symptoms should recur.  If he should have protracted symptoms the last more than 30 minutes he should go immediately to the emergency room. 2. HTN: Well-controlled.  He should continue taking beta-blockers.  Reminded him about the potential for beta-blocker rebound if he curtails these abruptly.  Also on losartan which is a good choice since he has diabetes, to prevent kidney abnormalities. 3. HLP: Most recent lipid profile showed all the parameters are in desirable range, albeit with a borderline HDL and mildly elevated triglycerides.  These will both improve with better glycemic control.  Continue statin. 4. DM: SGLT2 inhibitor is an excellent choice since he has chronic cardiac problems.  Has mild proteinuria.  However, overall glycemic control is not perfect, ideally would try to get his A1c less than 7%.  Discussed restriction of carbohydrate intake, in particular sweets (his weakness) and starches with high glycemic index. 5. History of gout: Most recent uric acid level 7.2, on allopurinol.    Medication Adjustments/Labs and Tests Ordered: Current medicines are reviewed at length with the patient today.  Concerns regarding medicines are outlined above.  Medication changes, Labs and Tests ordered today are listed in the Patient Instructions below. Patient Instructions  Medication Instructions:  No changes *If you need a refill on your cardiac medications before your next appointment, please call your pharmacy*   Lab Work: None ordered If you have labs (blood work) drawn today and your tests are completely normal, you will receive your results  only by: Marland Kitchen MyChart Message (if you have MyChart) OR . A paper copy in the mail If you have any lab test that is abnormal or we need to change your treatment, we will call you to review the results.   Testing/Procedures: None ordered   Follow-Up: At [facility], you and your health needs  are our priority.  As part of our continuing mission to provide you with exceptional heart care, we have created designated Provider Care Teams.  These Care Teams include your primary Cardiologist (physician) and Advanced Practice Providers (APPs -  Physician Assistants and Nurse Practitioners) who all work together to provide you with the care you need, when you need it.  We recommend signing up for the patient portal called "MyChart".  Sign up information is provided on this After Visit Summary.  MyChart is used to connect with patients for Virtual Visits (Telemedicine).  Patients are able to view lab/test results, encounter notes, upcoming appointments, etc.  Non-urgent messages can be sent to your provider as well.   To learn more about what you can do with MyChart, go to [email or web address].    Your next appointment:   12 month(s)  The format for your next appointment:   In Person  Provider:   You may see Sanda Klein, MD or one of the following Advanced Practice Providers on your designated Care Team:    Almyra Deforest, PA-C  Fabian Sharp, [place] or   Roby Lofts, PA-C        Signed, Sanda Klein, MD  03/30/2021 2:32 PM    [facility] [address], [place], [place]  [address] Phone: [phone]; Fax: [phone]

## 2021-04-05 ENCOUNTER — Other Ambulatory Visit (HOSPITAL_BASED_OUTPATIENT_CLINIC_OR_DEPARTMENT_OTHER): Payer: Self-pay

## 2021-04-05 MED ORDER — CARVEDILOL 25 MG PO TABS
ORAL_TABLET | ORAL | 0 refills | Status: AC
  Filled 2021-04-05: qty 60, 30d supply, fill #0

## 2021-04-20 ENCOUNTER — Other Ambulatory Visit (HOSPITAL_BASED_OUTPATIENT_CLINIC_OR_DEPARTMENT_OTHER): Payer: Self-pay

## 2021-04-20 MED ORDER — JARDIANCE 10 MG PO TABS
ORAL_TABLET | ORAL | 0 refills | Status: DC
  Filled 2021-04-20 (×2): qty 15, 15d supply, fill #0
  Filled 2021-04-20: qty 30, 30d supply, fill #0

## 2021-04-25 ENCOUNTER — Other Ambulatory Visit (HOSPITAL_BASED_OUTPATIENT_CLINIC_OR_DEPARTMENT_OTHER): Payer: Self-pay

## 2021-04-26 ENCOUNTER — Ambulatory Visit: Payer: 59 | Admitting: Cardiovascular Disease

## 2021-05-23 ENCOUNTER — Other Ambulatory Visit (HOSPITAL_BASED_OUTPATIENT_CLINIC_OR_DEPARTMENT_OTHER): Payer: Self-pay

## 2021-05-24 ENCOUNTER — Other Ambulatory Visit (HOSPITAL_BASED_OUTPATIENT_CLINIC_OR_DEPARTMENT_OTHER): Payer: Self-pay

## 2021-05-24 MED ORDER — ALLOPURINOL 100 MG PO TABS
ORAL_TABLET | ORAL | 0 refills | Status: DC
  Filled 2021-05-24: qty 90, 90d supply, fill #0

## 2021-05-24 MED ORDER — CARVEDILOL 25 MG PO TABS
ORAL_TABLET | ORAL | 0 refills | Status: DC
  Filled 2021-05-24: qty 60, 30d supply, fill #0

## 2021-05-28 ENCOUNTER — Other Ambulatory Visit (HOSPITAL_BASED_OUTPATIENT_CLINIC_OR_DEPARTMENT_OTHER): Payer: Self-pay

## 2021-06-01 ENCOUNTER — Other Ambulatory Visit (HOSPITAL_BASED_OUTPATIENT_CLINIC_OR_DEPARTMENT_OTHER): Payer: Self-pay

## 2021-06-01 DIAGNOSIS — E1169 Type 2 diabetes mellitus with other specified complication: Secondary | ICD-10-CM | POA: Diagnosis not present

## 2021-06-01 DIAGNOSIS — I10 Essential (primary) hypertension: Secondary | ICD-10-CM | POA: Diagnosis not present

## 2021-06-01 DIAGNOSIS — E7800 Pure hypercholesterolemia, unspecified: Secondary | ICD-10-CM | POA: Diagnosis not present

## 2021-06-01 DIAGNOSIS — M109 Gout, unspecified: Secondary | ICD-10-CM | POA: Diagnosis not present

## 2021-06-01 MED ORDER — ROSUVASTATIN CALCIUM 20 MG PO TABS
ORAL_TABLET | ORAL | 4 refills | Status: AC
  Filled 2021-06-01: qty 90, 90d supply, fill #0
  Filled 2021-07-04 – 2021-11-07 (×2): qty 90, 90d supply, fill #1
  Filled 2022-02-25: qty 90, 90d supply, fill #2

## 2021-06-01 MED ORDER — AMLODIPINE BESYLATE 5 MG PO TABS
ORAL_TABLET | ORAL | 4 refills | Status: AC
  Filled 2021-06-01: qty 90, 90d supply, fill #0
  Filled 2021-07-04 – 2021-11-07 (×2): qty 90, 90d supply, fill #1
  Filled 2022-02-25: qty 90, 90d supply, fill #2

## 2021-06-01 MED ORDER — LOSARTAN POTASSIUM 100 MG PO TABS
ORAL_TABLET | ORAL | 4 refills | Status: AC
  Filled 2021-06-01: qty 90, 90d supply, fill #0
  Filled 2021-11-20: qty 90, 90d supply, fill #1
  Filled 2022-05-16: qty 90, 90d supply, fill #2

## 2021-06-01 MED ORDER — CARVEDILOL 25 MG PO TABS
ORAL_TABLET | ORAL | 4 refills | Status: AC
  Filled 2021-06-01 – 2021-07-04 (×2): qty 180, 90d supply, fill #0
  Filled 2021-09-13: qty 180, 90d supply, fill #1

## 2021-06-01 MED ORDER — JARDIANCE 10 MG PO TABS
ORAL_TABLET | ORAL | 4 refills | Status: AC
  Filled 2021-06-01: qty 90, 90d supply, fill #0
  Filled 2021-10-10: qty 90, 90d supply, fill #1

## 2021-06-01 MED ORDER — ALLOPURINOL 100 MG PO TABS
ORAL_TABLET | ORAL | 4 refills | Status: AC
  Filled 2021-06-01 – 2021-09-24 (×2): qty 90, 90d supply, fill #0
  Filled 2021-12-17: qty 90, 90d supply, fill #1
  Filled 2022-03-18: qty 90, 90d supply, fill #2

## 2021-06-04 ENCOUNTER — Other Ambulatory Visit (HOSPITAL_BASED_OUTPATIENT_CLINIC_OR_DEPARTMENT_OTHER): Payer: Self-pay

## 2021-06-11 ENCOUNTER — Other Ambulatory Visit (HOSPITAL_BASED_OUTPATIENT_CLINIC_OR_DEPARTMENT_OTHER): Payer: Self-pay

## 2021-07-04 ENCOUNTER — Other Ambulatory Visit (HOSPITAL_BASED_OUTPATIENT_CLINIC_OR_DEPARTMENT_OTHER): Payer: Self-pay

## 2021-09-13 ENCOUNTER — Other Ambulatory Visit (HOSPITAL_BASED_OUTPATIENT_CLINIC_OR_DEPARTMENT_OTHER): Payer: Self-pay

## 2021-09-14 ENCOUNTER — Other Ambulatory Visit (HOSPITAL_BASED_OUTPATIENT_CLINIC_OR_DEPARTMENT_OTHER): Payer: Self-pay

## 2021-09-17 ENCOUNTER — Other Ambulatory Visit (HOSPITAL_BASED_OUTPATIENT_CLINIC_OR_DEPARTMENT_OTHER): Payer: Self-pay

## 2021-09-24 ENCOUNTER — Other Ambulatory Visit (HOSPITAL_BASED_OUTPATIENT_CLINIC_OR_DEPARTMENT_OTHER): Payer: Self-pay

## 2021-10-10 ENCOUNTER — Other Ambulatory Visit (HOSPITAL_BASED_OUTPATIENT_CLINIC_OR_DEPARTMENT_OTHER): Payer: Self-pay

## 2021-11-07 ENCOUNTER — Other Ambulatory Visit (HOSPITAL_BASED_OUTPATIENT_CLINIC_OR_DEPARTMENT_OTHER): Payer: Self-pay

## 2021-11-07 ENCOUNTER — Ambulatory Visit: Payer: 59 | Attending: Internal Medicine

## 2021-11-07 DIAGNOSIS — I10 Essential (primary) hypertension: Secondary | ICD-10-CM | POA: Diagnosis not present

## 2021-11-07 DIAGNOSIS — E7800 Pure hypercholesterolemia, unspecified: Secondary | ICD-10-CM | POA: Diagnosis not present

## 2021-11-07 DIAGNOSIS — M109 Gout, unspecified: Secondary | ICD-10-CM | POA: Diagnosis not present

## 2021-11-07 DIAGNOSIS — E1169 Type 2 diabetes mellitus with other specified complication: Secondary | ICD-10-CM | POA: Diagnosis not present

## 2021-11-07 DIAGNOSIS — Z23 Encounter for immunization: Secondary | ICD-10-CM

## 2021-11-07 MED ORDER — DICLOFENAC SODIUM 75 MG PO TBEC
DELAYED_RELEASE_TABLET | ORAL | 1 refills | Status: AC
  Filled 2021-11-07 – 2021-11-20 (×2): qty 60, 30d supply, fill #0

## 2021-11-07 NOTE — Progress Notes (Signed)
° °  Covid-19 Vaccination Clinic  Name:  [name]    MRN: [id number] DOB: 1971-08-15  11/07/2021  [name] was observed post Covid-19 immunization for 15 minutes without incident. He was provided with Vaccine Information Sheet and instruction to access the V-Safe system.   [name] was instructed to call 911 with any severe reactions post vaccine: Difficulty breathing  Swelling of face and throat  A fast heartbeat  A bad rash all over body  Dizziness and weakness   Immunizations Administered     Name Date Dose VIS Date Route   Pfizer Covid-19 Vaccine Bivalent Booster 11/07/2021 11:42 AM 0.3 mL 07/18/2021 Intramuscular   Manufacturer: [place]   Lot: [id number]   [place]: [phone]

## 2021-11-08 ENCOUNTER — Other Ambulatory Visit (HOSPITAL_BASED_OUTPATIENT_CLINIC_OR_DEPARTMENT_OTHER): Payer: Self-pay

## 2021-11-08 MED ORDER — PFIZER COVID-19 VAC BIVALENT 30 MCG/0.3ML IM SUSP
INTRAMUSCULAR | 0 refills | Status: AC
  Filled 2021-11-08: qty 0.3, 1d supply, fill #0

## 2021-11-16 ENCOUNTER — Other Ambulatory Visit (HOSPITAL_BASED_OUTPATIENT_CLINIC_OR_DEPARTMENT_OTHER): Payer: Self-pay

## 2021-11-20 ENCOUNTER — Other Ambulatory Visit (HOSPITAL_BASED_OUTPATIENT_CLINIC_OR_DEPARTMENT_OTHER): Payer: Self-pay

## 2021-11-20 MED ORDER — CARESTART COVID-19 HOME TEST VI KIT
PACK | 0 refills | Status: AC
  Filled 2021-11-20: qty 2, 4d supply, fill #0

## 2021-12-07 ENCOUNTER — Other Ambulatory Visit (HOSPITAL_BASED_OUTPATIENT_CLINIC_OR_DEPARTMENT_OTHER): Payer: Self-pay

## 2021-12-07 MED ORDER — JARDIANCE 25 MG PO TABS
25.0000 mg | ORAL_TABLET | Freq: Every day | ORAL | 0 refills | Status: DC
  Filled 2021-12-07: qty 30, 30d supply, fill #0

## 2021-12-10 ENCOUNTER — Other Ambulatory Visit (HOSPITAL_BASED_OUTPATIENT_CLINIC_OR_DEPARTMENT_OTHER): Payer: Self-pay

## 2021-12-17 ENCOUNTER — Other Ambulatory Visit (HOSPITAL_BASED_OUTPATIENT_CLINIC_OR_DEPARTMENT_OTHER): Payer: Self-pay

## 2022-01-02 ENCOUNTER — Other Ambulatory Visit (HOSPITAL_BASED_OUTPATIENT_CLINIC_OR_DEPARTMENT_OTHER): Payer: Self-pay

## 2022-01-02 DIAGNOSIS — E1169 Type 2 diabetes mellitus with other specified complication: Secondary | ICD-10-CM | POA: Diagnosis not present

## 2022-01-02 DIAGNOSIS — M109 Gout, unspecified: Secondary | ICD-10-CM | POA: Diagnosis not present

## 2022-01-02 DIAGNOSIS — E7800 Pure hypercholesterolemia, unspecified: Secondary | ICD-10-CM | POA: Diagnosis not present

## 2022-01-02 DIAGNOSIS — I10 Essential (primary) hypertension: Secondary | ICD-10-CM | POA: Diagnosis not present

## 2022-01-02 MED ORDER — AMLODIPINE BESYLATE 5 MG PO TABS
5.0000 mg | ORAL_TABLET | Freq: Every day | ORAL | 4 refills | Status: DC
  Filled 2022-01-02 – 2022-06-11 (×2): qty 90, 90d supply, fill #0
  Filled 2022-09-18: qty 90, 90d supply, fill #1

## 2022-01-02 MED ORDER — JARDIANCE 25 MG PO TABS
25.0000 mg | ORAL_TABLET | Freq: Every day | ORAL | 4 refills | Status: AC
  Filled 2022-01-02: qty 90, 90d supply, fill #0
  Filled 2022-05-01: qty 90, 90d supply, fill #1
  Filled 2022-08-12: qty 90, 90d supply, fill #2
  Filled 2022-10-18 – 2022-10-21 (×2): qty 90, 90d supply, fill #3
  Filled ????-??-??: fill #3

## 2022-01-02 MED ORDER — LOSARTAN POTASSIUM 100 MG PO TABS
100.0000 mg | ORAL_TABLET | Freq: Every day | ORAL | 4 refills | Status: DC
  Filled 2022-01-02 – 2022-12-17 (×2): qty 90, 90d supply, fill #0

## 2022-01-02 MED ORDER — ROSUVASTATIN CALCIUM 20 MG PO TABS
20.0000 mg | ORAL_TABLET | Freq: Every day | ORAL | 4 refills | Status: DC
  Filled 2022-01-02 – 2022-06-11 (×2): qty 90, 90d supply, fill #0
  Filled 2022-09-18: qty 90, 90d supply, fill #1

## 2022-01-02 MED ORDER — DICLOFENAC SODIUM 75 MG PO TBEC
75.0000 mg | DELAYED_RELEASE_TABLET | Freq: Two times a day (BID) | ORAL | 1 refills | Status: AC | PRN
  Filled 2022-01-02: qty 60, 30d supply, fill #0

## 2022-01-02 MED ORDER — ALLOPURINOL 100 MG PO TABS
100.0000 mg | ORAL_TABLET | Freq: Every day | ORAL | 4 refills | Status: AC
  Filled 2022-01-02 – 2022-06-26 (×2): qty 90, 90d supply, fill #0
  Filled 2022-10-04: qty 90, 90d supply, fill #1

## 2022-01-02 MED ORDER — CARVEDILOL 25 MG PO TABS
25.0000 mg | ORAL_TABLET | Freq: Two times a day (BID) | ORAL | 4 refills | Status: DC
  Filled 2022-01-02: qty 180, 90d supply, fill #0
  Filled 2022-08-15: qty 180, 90d supply, fill #1
  Filled 2022-12-17: qty 180, 90d supply, fill #2

## 2022-01-16 DIAGNOSIS — Z1211 Encounter for screening for malignant neoplasm of colon: Secondary | ICD-10-CM | POA: Diagnosis not present

## 2022-01-16 DIAGNOSIS — I10 Essential (primary) hypertension: Secondary | ICD-10-CM | POA: Diagnosis not present

## 2022-01-16 DIAGNOSIS — E1165 Type 2 diabetes mellitus with hyperglycemia: Secondary | ICD-10-CM | POA: Diagnosis not present

## 2022-02-04 ENCOUNTER — Other Ambulatory Visit (HOSPITAL_BASED_OUTPATIENT_CLINIC_OR_DEPARTMENT_OTHER): Payer: Self-pay

## 2022-02-04 MED ORDER — CLENPIQ 10-3.5-12 MG-GM -GM/160ML PO SOLN
ORAL | 0 refills | Status: DC
  Filled 2022-02-04: qty 320, 1d supply, fill #0

## 2022-02-07 ENCOUNTER — Other Ambulatory Visit (HOSPITAL_BASED_OUTPATIENT_CLINIC_OR_DEPARTMENT_OTHER): Payer: Self-pay

## 2022-02-08 ENCOUNTER — Other Ambulatory Visit (HOSPITAL_BASED_OUTPATIENT_CLINIC_OR_DEPARTMENT_OTHER): Payer: Self-pay

## 2022-02-08 MED ORDER — NA SULFATE-K SULFATE-MG SULF 17.5-3.13-1.6 GM/177ML PO SOLN
ORAL | 0 refills | Status: AC
  Filled 2022-02-08: qty 354, 1d supply, fill #0

## 2022-02-11 ENCOUNTER — Other Ambulatory Visit (HOSPITAL_BASED_OUTPATIENT_CLINIC_OR_DEPARTMENT_OTHER): Payer: Self-pay

## 2022-02-11 DIAGNOSIS — E1169 Type 2 diabetes mellitus with other specified complication: Secondary | ICD-10-CM | POA: Diagnosis not present

## 2022-02-12 ENCOUNTER — Telehealth: Payer: Self-pay

## 2022-02-12 NOTE — Telephone Encounter (Signed)
? ?  Pre-operative Risk Assessment  ?  ?Patient Name: [name]  ?DOB: [birth year] ?MRN: [id number]  ? ?  ? ?Request for Surgical Clearance   ? ?Procedure:   COLONOSCOPY  ? ?Date of Surgery:  Clearance 02/21/22                              ?   ?Surgeon:  DR Benson Norway ?Surgeon's Group or Practice Name:  [facility]  ?Phone number:  [phone] ?Fax number:  [phone] ?  ?Type of Clearance Requested:   ?- Medical  ?  ?Type of Anesthesia:   PROPOFOL ?  ?Additional requests/questions:   ? ? ?

## 2022-02-13 NOTE — Telephone Encounter (Signed)
Patient has not been seen in May 2022, please arrange a virtual telephone visit with preop APP.  ?

## 2022-02-13 NOTE — Telephone Encounter (Signed)
Left msg for patient to call and schedule tele health appointment. ?

## 2022-02-14 ENCOUNTER — Telehealth: Payer: Self-pay | Admitting: *Deleted

## 2022-02-14 NOTE — Telephone Encounter (Signed)
Patient was returning call. Please advise ?

## 2022-02-14 NOTE — Telephone Encounter (Signed)
Left message for the pt to call the office ASAP and set up a tele pre op appt. Procedure is set for 02/21/22 at this time. I will send  FYI to requesting office the pt will need a telephone appt with the pre op provider be he can be cleared.  ?

## 2022-02-14 NOTE — Telephone Encounter (Signed)
Pt agreeable to plan of care for tele pre op appt 02/15/22 @ 2 pm. Procedure is set for 02/21/22. Med rec and consent are done.  ?

## 2022-02-14 NOTE — Telephone Encounter (Signed)
I tried to return the pt's call but got his vm. I left message again to call back and ask to s/w pre op team ?

## 2022-02-14 NOTE — Telephone Encounter (Signed)
Pt agreeable to plan of care for tele pre op appt 02/15/22 @ 2 pm. Procedure is set for 02/21/22. Med rec and consent are done.  ? ?  ?Patient Consent for Virtual Visit  ? ? ?   ? ?[name] has provided verbal consent on 02/14/2022 for a virtual visit (video or telephone). ? ? ?CONSENT FOR VIRTUAL VISIT FOR:  [name]  ?By participating in this virtual visit I agree to the following: ? ?I hereby voluntarily request, consent and authorize [facility] and its employed or contracted physicians, physician assistants, nurse practitioners or other licensed health care professionals (the Practitioner), to provide me with telemedicine health care services (the ?Services") as deemed necessary by the treating Practitioner. I acknowledge and consent to receive the Services by the Practitioner via telemedicine. I understand that the telemedicine visit will involve communicating with the Practitioner through live audiovisual communication technology and the disclosure of certain medical information by electronic transmission. I acknowledge that I have been given the opportunity to request an in-person assessment or other available alternative prior to the telemedicine visit and am voluntarily participating in the telemedicine visit. ? ?I understand that I have the right to withhold or withdraw my consent to the use of telemedicine in the course of my care at any time, without affecting my right to future care or treatment, and that the Practitioner or I may terminate the telemedicine visit at any time. I understand that I have the right to inspect all information obtained and/or recorded in the course of the telemedicine visit and may receive copies of available information for a reasonable fee.  I understand that some of the potential risks of receiving the Services via telemedicine include:  ?Delay or interruption in medical evaluation due to technological equipment failure or disruption; ?Information  transmitted may not be sufficient (e.g. poor resolution of images) to allow for appropriate medical decision making by the Practitioner; and/or  ?In rare instances, security protocols could fail, causing a breach of personal health information. ? ?Furthermore, I acknowledge that it is my responsibility to provide information about my medical history, conditions and care that is complete and accurate to the best of my ability. I acknowledge that Practitioner's advice, recommendations, and/or decision may be based on factors not within their control, such as incomplete or inaccurate data provided by me or distortions of diagnostic images or specimens that may result from electronic transmissions. I understand that the practice of medicine is not an exact science and that Practitioner makes no warranties or guarantees regarding treatment outcomes. I acknowledge that a copy of this consent can be made available to me via my patient portal ([facility]), or I can request a printed copy by calling the office of [facility].   ? ?I understand that my insurance will be billed for this visit.  ? ?I have read or had this consent read to me. ?I understand the contents of this consent, which adequately explains the benefits and risks of the Services being provided via telemedicine.  ?I have been provided ample opportunity to ask questions regarding this consent and the Services and have had my questions answered to my satisfaction. ?I give my informed consent for the services to be provided through the use of telemedicine in my medical care ? ? ? ?

## 2022-02-15 ENCOUNTER — Ambulatory Visit (INDEPENDENT_AMBULATORY_CARE_PROVIDER_SITE_OTHER): Payer: 59 | Admitting: General Practice

## 2022-02-15 DIAGNOSIS — Z01810 Encounter for preprocedural cardiovascular examination: Secondary | ICD-10-CM

## 2022-02-15 NOTE — Progress Notes (Signed)
? ?Virtual Visit via Telephone Note  ? ?This visit type was conducted due to national recommendations for restrictions regarding the COVID-19 Pandemic (e.g. social distancing) in an effort to limit this patient's exposure and mitigate transmission in our community.  Due to his co-morbid illnesses, this patient is at least at moderate risk for complications without adequate follow up.  This format is felt to be most appropriate for this patient at this time.  The patient did not have access to video technology/had technical difficulties with video requiring transitioning to audio format only (telephone).  All issues noted in this document were discussed and addressed.  No physical exam could be performed with this format.  Please refer to the patient's chart for his  consent to telehealth for [facility]. ? ?Evaluation Performed:  Preoperative cardiovascular risk assessment ?_____________  ? ?Date:  02/15/2022  ? ?Patient ID:  [name], [name] Jan 03, 1971, MRN [id number] ?Patient Location:  ?Home ?Provider location:   ?Office ? ?Primary Care Provider:  Lawerance Cruel, MD ?Primary Cardiologist:  Sanda Klein, MD ? ?Chief Complaint  ?  ?51 y.o. y/o male with a h/o coronary artery disease, HTN, HLD, who is pending colonoscopy, and presents today for telephonic preoperative cardiovascular risk assessment. ? ?Past Medical History  ?  ?Past Medical History:  ?Diagnosis Date  ? Coronary artery disease, occlusive   ? Hyperlipidemia   ? Hypertension   ? ?Past Surgical History:  ?Procedure Laterality Date  ? CORONARY ARTERY BYPASS GRAFT    ? Coronary artery bypass grafting x6  07/17/11  ? [facility]  ? ? ?Allergies ? ?No Known Allergies ? ?History of Present Illness  ?  ?[name] is a 51 y.o. male who presents via [employer] (consulting) for a telehealth visit today.  Pt was last seen in cardiology clinic on 03/23/2021 by Dr. Sallyanne Kuster.  At that time [name] was doing well .  The patient is now pending  colonoscopy.  Since his last visit, he remains stable from a cardiac standpoint. ? ?Today he denies chest pain, shortness of breath, lower extremity edema, fatigue, palpitations, melena, hematuria, hemoptysis, diaphoresis, weakness, presyncope, syncope, orthopnea, and PND. ? ? ? ?Home Medications  ?  ?Prior to Admission medications   ?Medication Sig Start Date End Date Taking? Authorizing Provider  ?allopurinol (ZYLOPRIM) 100 MG tablet TAKE 1 TABLET BY MOUTH ONCE DAILY 02/28/20 02/27/21  Lawerance Cruel, MD  ?allopurinol (ZYLOPRIM) 100 MG tablet Take 1 tablet by mouth once daily ?Patient not taking: Reported on 02/14/2022 06/01/21     ?allopurinol (ZYLOPRIM) 100 MG tablet Take 1 tablet (100 mg total) by mouth daily. 01/02/22     ?amLODipine (NORVASC) 5 MG tablet TAKE 1 TABLET BY MOUTH ONCE A DAY 02/28/20 02/27/21  Lawerance Cruel, MD  ?amLODipine (NORVASC) 5 MG tablet Take 1 tablet by mouth once a daily ?Patient not taking: Reported on 02/14/2022 06/01/21     ?amLODipine (NORVASC) 5 MG tablet Take 1 tablet (5 mg total) by mouth daily. 01/02/22     ?aspirin 81 MG tablet Take 81 mg by mouth daily. ?Patient not taking: Reported on 02/14/2022    [provider]  ?carvedilol (COREG) 12.5 MG tablet TAKE 1 TABLET (12.5 MG TOTAL) BY MOUTH 2 (TWO) TIMES DAILY WITH A MEAL. ?Patient not taking: Reported on 02/14/2022 12/09/17   Croitoru, Dani Gobble, MD  ?carvedilol (COREG) 25 MG tablet Take 1 tablet by mouth twice a day ?Patient not taking: Reported on 02/14/2022 04/05/21     ?  carvedilol (COREG) 25 MG tablet Take 1 tablet by mouth 2 times daily ?Patient not taking: Reported on 02/14/2022 06/01/21     ?carvedilol (COREG) 25 MG tablet Take 1 tablet (25 mg total) by mouth 2 (two) times daily. 01/02/22     ?COVID-19 At Home Antigen Test [facility] COVID-19 HOME TEST) KIT Use as directed 11/20/21   Clementeen [name], [place]  ?COVID-19 mRNA bivalent vaccine, Pfizer, (PFIZER COVID-19 VAC BIVALENT) injection Inject into the muscle. 11/07/21    Carlyle Basques, MD  ?diclofenac (VOLTAREN) 75 MG EC tablet Take 1 tablet by mouth twice daily as needed ?Patient not taking: Reported on 02/14/2022 11/07/21     ?diclofenac (VOLTAREN) 75 MG EC tablet Take 1 tablet (75 mg total) by mouth 2 (two) times daily as needed. 01/02/22     ?empagliflozin (JARDIANCE) 10 MG TABS tablet Take by mouth daily. ?Patient not taking: Reported on 02/14/2022    [provider]  ?empagliflozin (JARDIANCE) 10 MG TABS tablet Take 1 tablet by mouth once daily ?Patient not taking: Reported on 02/14/2022 06/01/21     ?empagliflozin (JARDIANCE) 25 MG TABS tablet Take 1 tablet (25 mg total) by mouth daily. 01/02/22     ?ibuprofen (ADVIL,MOTRIN) 200 MG tablet Take 200 mg by mouth every 6 (six) hours as needed.    [provider]  ?losartan (COZAAR) 100 MG tablet TAKE 1 TABLET BY MOUTH ONCE DAILY 02/28/20 02/27/21  Lawerance Cruel, MD  ?losartan (COZAAR) 100 MG tablet Take 1 tablet by mouth once daily ?Patient not taking: Reported on 02/14/2022 06/01/21     ?losartan (COZAAR) 100 MG tablet Take 1 tablet (100 mg total) by mouth daily. 01/02/22     ?Na Sulfate-K Sulfate-Mg Sulf (SUPREP BOWEL PREP KIT) 17.5-3.13-1.6 GM/177ML SOLN Take by mouth as directed to instructions inside prep kit 02/08/22     ?rosuvastatin (CRESTOR) 20 MG tablet TAKE 1 TABLET BY MOUTH ONCE DAILY 02/28/20 02/27/21  Lawerance Cruel, MD  ?rosuvastatin (CRESTOR) 20 MG tablet Take 1 tablet by mouth once daily ?Patient not taking: Reported on 02/14/2022 06/01/21     ?rosuvastatin (CRESTOR) 20 MG tablet Take 1 tablet (20 mg total) by mouth daily. 01/02/22     ? ? ?Physical Exam  ?  ?Vital Signs:  [name] does not have vital signs available for review today. ? ?Given telephonic nature of communication, physical exam is limited. ?AAOx3. NAD. Normal affect.  Speech and respirations are unlabored. ? ?Accessory Clinical Findings  ?  ?None ? ?Assessment & Plan  ?  ?1.  Preoperative Cardiovascular Risk Assessment: ?   ? ?Primary Cardiologist: Sanda Klein, MD ? ?Chart reviewed as part of pre-operative protocol coverage. Given past medical history and time since last visit, based on ACC/AHA guidelines, [name] would be at acceptable risk for the planned procedure without further cardiovascular testing.  ? ?Patient was advised that if he develops new symptoms prior to surgery to contact our office to arrange a follow-up appointment.  He verbalized understanding. ? ? ? ?A copy of this note will be routed to requesting surgeon. ? ?Time:   ?Today, I have spent 5 minutes with the patient with telehealth technology discussing medical history, symptoms, and management plan.  Spent greater than 10 minutes prior to the phone evaluation reviewing patient's past medical history and medications. ? ? ?Deberah Pelton, NP ? ?02/15/2022, 9:52 AM ? ?

## 2022-02-21 DIAGNOSIS — D125 Benign neoplasm of sigmoid colon: Secondary | ICD-10-CM | POA: Diagnosis not present

## 2022-02-21 DIAGNOSIS — Z1211 Encounter for screening for malignant neoplasm of colon: Secondary | ICD-10-CM | POA: Diagnosis not present

## 2022-02-21 DIAGNOSIS — K635 Polyp of colon: Secondary | ICD-10-CM | POA: Diagnosis not present

## 2022-02-21 DIAGNOSIS — K5730 Diverticulosis of large intestine without perforation or abscess without bleeding: Secondary | ICD-10-CM | POA: Diagnosis not present

## 2022-02-25 ENCOUNTER — Other Ambulatory Visit (HOSPITAL_BASED_OUTPATIENT_CLINIC_OR_DEPARTMENT_OTHER): Payer: Self-pay

## 2022-03-18 ENCOUNTER — Other Ambulatory Visit (HOSPITAL_BASED_OUTPATIENT_CLINIC_OR_DEPARTMENT_OTHER): Payer: Self-pay

## 2022-05-01 ENCOUNTER — Other Ambulatory Visit (HOSPITAL_BASED_OUTPATIENT_CLINIC_OR_DEPARTMENT_OTHER): Payer: Self-pay

## 2022-05-16 ENCOUNTER — Other Ambulatory Visit (HOSPITAL_BASED_OUTPATIENT_CLINIC_OR_DEPARTMENT_OTHER): Payer: Self-pay

## 2022-06-11 ENCOUNTER — Other Ambulatory Visit (HOSPITAL_BASED_OUTPATIENT_CLINIC_OR_DEPARTMENT_OTHER): Payer: Self-pay

## 2022-06-26 ENCOUNTER — Other Ambulatory Visit (HOSPITAL_BASED_OUTPATIENT_CLINIC_OR_DEPARTMENT_OTHER): Payer: Self-pay

## 2022-08-12 ENCOUNTER — Other Ambulatory Visit (HOSPITAL_BASED_OUTPATIENT_CLINIC_OR_DEPARTMENT_OTHER): Payer: Self-pay

## 2022-08-15 ENCOUNTER — Other Ambulatory Visit (HOSPITAL_BASED_OUTPATIENT_CLINIC_OR_DEPARTMENT_OTHER): Payer: Self-pay

## 2022-08-15 MED ORDER — FLUARIX QUADRIVALENT 0.5 ML IM SUSY
PREFILLED_SYRINGE | INTRAMUSCULAR | 0 refills | Status: AC
  Filled 2022-08-15: qty 0.5, 1d supply, fill #0

## 2022-09-13 ENCOUNTER — Other Ambulatory Visit (HOSPITAL_BASED_OUTPATIENT_CLINIC_OR_DEPARTMENT_OTHER): Payer: Self-pay

## 2022-09-18 ENCOUNTER — Other Ambulatory Visit (HOSPITAL_BASED_OUTPATIENT_CLINIC_OR_DEPARTMENT_OTHER): Payer: Self-pay

## 2022-09-30 ENCOUNTER — Other Ambulatory Visit (HOSPITAL_BASED_OUTPATIENT_CLINIC_OR_DEPARTMENT_OTHER): Payer: Self-pay

## 2022-10-04 ENCOUNTER — Other Ambulatory Visit (HOSPITAL_BASED_OUTPATIENT_CLINIC_OR_DEPARTMENT_OTHER): Payer: Self-pay

## 2022-10-18 ENCOUNTER — Other Ambulatory Visit (HOSPITAL_BASED_OUTPATIENT_CLINIC_OR_DEPARTMENT_OTHER): Payer: Self-pay

## 2022-10-21 ENCOUNTER — Other Ambulatory Visit (HOSPITAL_BASED_OUTPATIENT_CLINIC_OR_DEPARTMENT_OTHER): Payer: Self-pay

## 2022-12-17 ENCOUNTER — Other Ambulatory Visit (HOSPITAL_BASED_OUTPATIENT_CLINIC_OR_DEPARTMENT_OTHER): Payer: Self-pay

## 2023-01-07 ENCOUNTER — Other Ambulatory Visit (HOSPITAL_BASED_OUTPATIENT_CLINIC_OR_DEPARTMENT_OTHER): Payer: Self-pay

## 2023-01-08 ENCOUNTER — Other Ambulatory Visit (HOSPITAL_BASED_OUTPATIENT_CLINIC_OR_DEPARTMENT_OTHER): Payer: Self-pay

## 2023-01-08 MED ORDER — ROSUVASTATIN CALCIUM 20 MG PO TABS
20.0000 mg | ORAL_TABLET | Freq: Every day | ORAL | 0 refills | Status: DC
  Filled 2023-01-08: qty 90, 90d supply, fill #0

## 2023-01-08 MED ORDER — AMLODIPINE BESYLATE 5 MG PO TABS
5.0000 mg | ORAL_TABLET | Freq: Every day | ORAL | 0 refills | Status: DC
  Filled 2023-01-08: qty 90, 90d supply, fill #0

## 2023-01-20 ENCOUNTER — Other Ambulatory Visit (HOSPITAL_BASED_OUTPATIENT_CLINIC_OR_DEPARTMENT_OTHER): Payer: Self-pay

## 2023-01-22 ENCOUNTER — Other Ambulatory Visit (HOSPITAL_BASED_OUTPATIENT_CLINIC_OR_DEPARTMENT_OTHER): Payer: Self-pay

## 2023-01-24 ENCOUNTER — Other Ambulatory Visit (HOSPITAL_BASED_OUTPATIENT_CLINIC_OR_DEPARTMENT_OTHER): Payer: Self-pay

## 2023-01-24 MED ORDER — ALLOPURINOL 100 MG PO TABS
100.0000 mg | ORAL_TABLET | Freq: Every day | ORAL | 0 refills | Status: DC
  Filled 2023-01-24: qty 30, 30d supply, fill #0

## 2023-01-27 ENCOUNTER — Other Ambulatory Visit (HOSPITAL_BASED_OUTPATIENT_CLINIC_OR_DEPARTMENT_OTHER): Payer: Self-pay

## 2023-02-26 ENCOUNTER — Other Ambulatory Visit (HOSPITAL_BASED_OUTPATIENT_CLINIC_OR_DEPARTMENT_OTHER): Payer: Self-pay

## 2023-02-26 DIAGNOSIS — E7800 Pure hypercholesterolemia, unspecified: Secondary | ICD-10-CM | POA: Diagnosis not present

## 2023-02-26 DIAGNOSIS — M109 Gout, unspecified: Secondary | ICD-10-CM | POA: Diagnosis not present

## 2023-02-26 DIAGNOSIS — E1169 Type 2 diabetes mellitus with other specified complication: Secondary | ICD-10-CM | POA: Diagnosis not present

## 2023-02-26 DIAGNOSIS — I10 Essential (primary) hypertension: Secondary | ICD-10-CM | POA: Diagnosis not present

## 2023-02-26 DIAGNOSIS — Z0000 Encounter for general adult medical examination without abnormal findings: Secondary | ICD-10-CM | POA: Diagnosis not present

## 2023-02-26 DIAGNOSIS — Z6827 Body mass index (BMI) 27.0-27.9, adult: Secondary | ICD-10-CM | POA: Diagnosis not present

## 2023-02-26 MED ORDER — LOSARTAN POTASSIUM 100 MG PO TABS
100.0000 mg | ORAL_TABLET | Freq: Every day | ORAL | 4 refills | Status: AC
  Filled 2023-02-26 – 2023-04-04 (×2): qty 90, 90d supply, fill #0
  Filled 2023-07-22: qty 90, 90d supply, fill #1
  Filled 2023-10-15: qty 90, 90d supply, fill #2

## 2023-02-26 MED ORDER — JARDIANCE 25 MG PO TABS
25.0000 mg | ORAL_TABLET | Freq: Every day | ORAL | 4 refills | Status: AC
  Filled 2023-02-26: qty 90, 90d supply, fill #0
  Filled 2023-06-09: qty 90, 90d supply, fill #1
  Filled 2023-09-10: qty 90, 90d supply, fill #2
  Filled 2023-12-05: qty 90, 90d supply, fill #3

## 2023-02-26 MED ORDER — CARVEDILOL 25 MG PO TABS
25.0000 mg | ORAL_TABLET | Freq: Two times a day (BID) | ORAL | 4 refills | Status: AC
  Filled 2023-02-26 – 2023-04-04 (×2): qty 180, 90d supply, fill #0
  Filled 2023-07-22: qty 180, 90d supply, fill #1
  Filled 2023-10-15: qty 180, 90d supply, fill #2

## 2023-02-26 MED ORDER — ROSUVASTATIN CALCIUM 20 MG PO TABS
20.0000 mg | ORAL_TABLET | Freq: Every day | ORAL | 4 refills | Status: AC
  Filled 2023-02-26 – 2023-05-06 (×2): qty 90, 90d supply, fill #0
  Filled 2023-08-12: qty 90, 90d supply, fill #1
  Filled 2023-11-11: qty 90, 90d supply, fill #2

## 2023-02-26 MED ORDER — AMLODIPINE BESYLATE 5 MG PO TABS
5.0000 mg | ORAL_TABLET | Freq: Every day | ORAL | 4 refills | Status: AC
  Filled 2023-02-26 – 2023-05-06 (×2): qty 90, 90d supply, fill #0
  Filled 2023-08-12: qty 90, 90d supply, fill #1
  Filled 2023-11-11: qty 90, 90d supply, fill #2

## 2023-02-26 MED ORDER — ALLOPURINOL 100 MG PO TABS
100.0000 mg | ORAL_TABLET | Freq: Every day | ORAL | 4 refills | Status: AC
  Filled 2023-02-26: qty 90, 90d supply, fill #0
  Filled 2023-06-09: qty 90, 90d supply, fill #1
  Filled 2023-09-10: qty 90, 90d supply, fill #2
  Filled 2023-12-05: qty 90, 90d supply, fill #3

## 2023-03-03 ENCOUNTER — Other Ambulatory Visit (HOSPITAL_BASED_OUTPATIENT_CLINIC_OR_DEPARTMENT_OTHER): Payer: Self-pay

## 2023-03-05 ENCOUNTER — Other Ambulatory Visit (HOSPITAL_BASED_OUTPATIENT_CLINIC_OR_DEPARTMENT_OTHER): Payer: Self-pay

## 2023-03-05 DIAGNOSIS — Z125 Encounter for screening for malignant neoplasm of prostate: Secondary | ICD-10-CM | POA: Diagnosis not present

## 2023-03-05 DIAGNOSIS — Z0000 Encounter for general adult medical examination without abnormal findings: Secondary | ICD-10-CM | POA: Diagnosis not present

## 2023-03-05 DIAGNOSIS — E1169 Type 2 diabetes mellitus with other specified complication: Secondary | ICD-10-CM | POA: Diagnosis not present

## 2023-03-05 DIAGNOSIS — M109 Gout, unspecified: Secondary | ICD-10-CM | POA: Diagnosis not present

## 2023-03-07 ENCOUNTER — Other Ambulatory Visit (HOSPITAL_BASED_OUTPATIENT_CLINIC_OR_DEPARTMENT_OTHER): Payer: Self-pay

## 2023-03-07 MED ORDER — PIOGLITAZONE HCL 15 MG PO TABS
15.0000 mg | ORAL_TABLET | Freq: Every day | ORAL | 6 refills | Status: DC
  Filled 2023-03-07: qty 30, 30d supply, fill #0
  Filled 2023-04-04: qty 30, 30d supply, fill #1
  Filled 2023-05-06: qty 90, 90d supply, fill #2
  Filled 2023-07-22: qty 60, 60d supply, fill #3

## 2023-03-10 ENCOUNTER — Other Ambulatory Visit (HOSPITAL_BASED_OUTPATIENT_CLINIC_OR_DEPARTMENT_OTHER): Payer: Self-pay

## 2023-04-04 ENCOUNTER — Other Ambulatory Visit (HOSPITAL_BASED_OUTPATIENT_CLINIC_OR_DEPARTMENT_OTHER): Payer: Self-pay

## 2023-05-06 ENCOUNTER — Other Ambulatory Visit (HOSPITAL_BASED_OUTPATIENT_CLINIC_OR_DEPARTMENT_OTHER): Payer: Self-pay

## 2023-06-09 ENCOUNTER — Other Ambulatory Visit (HOSPITAL_BASED_OUTPATIENT_CLINIC_OR_DEPARTMENT_OTHER): Payer: Self-pay

## 2023-06-12 ENCOUNTER — Other Ambulatory Visit (HOSPITAL_BASED_OUTPATIENT_CLINIC_OR_DEPARTMENT_OTHER): Payer: Self-pay

## 2023-07-18 ENCOUNTER — Other Ambulatory Visit (HOSPITAL_BASED_OUTPATIENT_CLINIC_OR_DEPARTMENT_OTHER): Payer: Self-pay

## 2023-07-22 ENCOUNTER — Other Ambulatory Visit (HOSPITAL_BASED_OUTPATIENT_CLINIC_OR_DEPARTMENT_OTHER): Payer: Self-pay

## 2023-08-12 ENCOUNTER — Other Ambulatory Visit (HOSPITAL_BASED_OUTPATIENT_CLINIC_OR_DEPARTMENT_OTHER): Payer: Self-pay

## 2023-08-14 ENCOUNTER — Other Ambulatory Visit (HOSPITAL_BASED_OUTPATIENT_CLINIC_OR_DEPARTMENT_OTHER): Payer: Self-pay

## 2023-08-14 MED ORDER — FLULAVAL 0.5 ML IM SUSY
0.5000 mL | PREFILLED_SYRINGE | Freq: Once | INTRAMUSCULAR | 0 refills | Status: AC
  Filled 2023-08-14: qty 0.5, 1d supply, fill #0

## 2023-09-10 ENCOUNTER — Other Ambulatory Visit (HOSPITAL_BASED_OUTPATIENT_CLINIC_OR_DEPARTMENT_OTHER): Payer: Self-pay

## 2023-10-14 ENCOUNTER — Other Ambulatory Visit (HOSPITAL_BASED_OUTPATIENT_CLINIC_OR_DEPARTMENT_OTHER): Payer: Self-pay

## 2023-10-14 MED ORDER — PIOGLITAZONE HCL 15 MG PO TABS
15.0000 mg | ORAL_TABLET | Freq: Every day | ORAL | 0 refills | Status: DC
  Filled 2023-10-14: qty 30, 30d supply, fill #0

## 2023-10-15 ENCOUNTER — Other Ambulatory Visit (HOSPITAL_BASED_OUTPATIENT_CLINIC_OR_DEPARTMENT_OTHER): Payer: Self-pay

## 2023-11-10 ENCOUNTER — Other Ambulatory Visit (HOSPITAL_BASED_OUTPATIENT_CLINIC_OR_DEPARTMENT_OTHER): Payer: Self-pay

## 2023-11-10 MED ORDER — PIOGLITAZONE HCL 15 MG PO TABS
15.0000 mg | ORAL_TABLET | Freq: Every day | ORAL | 0 refills | Status: AC
  Filled 2023-11-10: qty 15, 15d supply, fill #0

## 2023-11-11 ENCOUNTER — Other Ambulatory Visit: Payer: Self-pay

## 2023-11-11 ENCOUNTER — Other Ambulatory Visit (HOSPITAL_BASED_OUTPATIENT_CLINIC_OR_DEPARTMENT_OTHER): Payer: Self-pay

## 2023-12-05 ENCOUNTER — Other Ambulatory Visit (HOSPITAL_BASED_OUTPATIENT_CLINIC_OR_DEPARTMENT_OTHER): Payer: Self-pay

## 2023-12-09 ENCOUNTER — Other Ambulatory Visit (HOSPITAL_BASED_OUTPATIENT_CLINIC_OR_DEPARTMENT_OTHER): Payer: Self-pay

## 2023-12-09 MED ORDER — PIOGLITAZONE HCL 15 MG PO TABS
15.0000 mg | ORAL_TABLET | Freq: Every day | ORAL | 0 refills | Status: DC
  Filled 2023-12-09: qty 15, 15d supply, fill #0

## 2023-12-24 ENCOUNTER — Other Ambulatory Visit (HOSPITAL_BASED_OUTPATIENT_CLINIC_OR_DEPARTMENT_OTHER): Payer: Self-pay

## 2023-12-29 ENCOUNTER — Other Ambulatory Visit (HOSPITAL_BASED_OUTPATIENT_CLINIC_OR_DEPARTMENT_OTHER): Payer: Self-pay

## 2023-12-29 MED ORDER — LOSARTAN POTASSIUM 100 MG PO TABS
100.0000 mg | ORAL_TABLET | Freq: Every day | ORAL | 4 refills | Status: AC
  Filled 2023-12-29: qty 90, 90d supply, fill #0
  Filled 2024-12-21: qty 30, 30d supply, fill #1

## 2023-12-29 MED ORDER — PIOGLITAZONE HCL 15 MG PO TABS
15.0000 mg | ORAL_TABLET | Freq: Every day | ORAL | 4 refills | Status: AC
  Filled 2023-12-29: qty 90, 90d supply, fill #0
  Filled 2024-03-30: qty 90, 90d supply, fill #1
  Filled 2024-06-23: qty 30, 30d supply, fill #2
  Filled 2024-07-26: qty 30, 30d supply, fill #3
  Filled 2024-08-27: qty 30, 30d supply, fill #4
  Filled 2024-09-21: qty 30, 30d supply, fill #5
  Filled 2024-10-26: qty 30, 30d supply, fill #6
  Filled 2024-11-22 (×2): qty 30, 30d supply, fill #7
  Filled 2024-12-14 – 2024-12-15 (×2): qty 30, 30d supply, fill #8

## 2023-12-29 MED ORDER — CARVEDILOL 25 MG PO TABS
25.0000 mg | ORAL_TABLET | Freq: Two times a day (BID) | ORAL | 4 refills | Status: AC
  Filled 2023-12-29: qty 180, 90d supply, fill #0
  Filled 2024-04-19: qty 60, 30d supply, fill #1
  Filled 2024-07-14: qty 60, 30d supply, fill #2
  Filled 2024-08-12: qty 60, 30d supply, fill #3
  Filled 2024-09-08: qty 60, 30d supply, fill #4
  Filled 2024-10-18: qty 60, 30d supply, fill #5
  Filled 2024-11-16: qty 60, 30d supply, fill #6
  Filled 2024-12-14: qty 60, 30d supply, fill #7

## 2023-12-29 MED ORDER — ROSUVASTATIN CALCIUM 20 MG PO TABS
20.0000 mg | ORAL_TABLET | Freq: Every day | ORAL | 4 refills | Status: AC
  Filled 2024-03-30: qty 30, 30d supply, fill #0
  Filled 2024-04-28: qty 30, 30d supply, fill #1
  Filled 2024-05-26: qty 30, 30d supply, fill #2
  Filled 2024-06-23: qty 30, 30d supply, fill #3
  Filled 2024-07-22: qty 30, 30d supply, fill #4
  Filled 2024-08-27: qty 30, 30d supply, fill #5
  Filled 2024-09-21: qty 30, 30d supply, fill #6
  Filled 2024-10-26: qty 30, 30d supply, fill #7
  Filled 2024-11-22 (×2): qty 30, 30d supply, fill #8
  Filled 2024-12-21: qty 30, 30d supply, fill #9

## 2023-12-29 MED ORDER — JARDIANCE 25 MG PO TABS
25.0000 mg | ORAL_TABLET | Freq: Every day | ORAL | 4 refills | Status: AC
  Filled 2023-12-29: qty 90, 90d supply, fill #0
  Filled 2024-03-30: qty 30, 30d supply, fill #0
  Filled 2024-04-28: qty 30, 30d supply, fill #1
  Filled 2024-05-26: qty 30, 30d supply, fill #2
  Filled 2024-06-23: qty 30, 30d supply, fill #3
  Filled 2024-07-22: qty 30, 30d supply, fill #4
  Filled 2024-08-27: qty 30, 30d supply, fill #5
  Filled 2024-09-21: qty 30, 30d supply, fill #6
  Filled 2024-10-26: qty 30, 30d supply, fill #7
  Filled 2024-11-22 (×2): qty 30, 30d supply, fill #8
  Filled 2024-12-14 – 2024-12-15 (×2): qty 30, 30d supply, fill #9

## 2023-12-29 MED ORDER — ALLOPURINOL 100 MG PO TABS
100.0000 mg | ORAL_TABLET | Freq: Every day | ORAL | 4 refills | Status: AC
  Filled 2023-12-29: qty 90, 90d supply, fill #0
  Filled 2024-03-05: qty 30, 30d supply, fill #0
  Filled 2024-04-19: qty 30, 30d supply, fill #1
  Filled 2024-06-23: qty 30, 30d supply, fill #2
  Filled 2024-07-22 (×2): qty 30, 30d supply, fill #3
  Filled 2024-08-17: qty 30, 30d supply, fill #4
  Filled 2024-09-17: qty 30, 30d supply, fill #5
  Filled 2024-10-18 (×2): qty 30, 30d supply, fill #6
  Filled 2024-11-16: qty 30, 30d supply, fill #7
  Filled 2024-12-14: qty 30, 30d supply, fill #8

## 2023-12-29 MED ORDER — AMLODIPINE BESYLATE 5 MG PO TABS
5.0000 mg | ORAL_TABLET | Freq: Every day | ORAL | 4 refills | Status: AC
  Filled 2024-03-05: qty 30, 30d supply, fill #0
  Filled 2024-04-19: qty 30, 30d supply, fill #1
  Filled 2024-05-26: qty 30, 30d supply, fill #2
  Filled 2024-06-23: qty 30, 30d supply, fill #3
  Filled 2024-07-22 (×2): qty 30, 30d supply, fill #4
  Filled 2024-08-27: qty 30, 30d supply, fill #5
  Filled 2024-09-21: qty 30, 30d supply, fill #6
  Filled 2024-10-26: qty 30, 30d supply, fill #7
  Filled 2024-11-22 (×2): qty 30, 30d supply, fill #8
  Filled 2024-12-14 – 2024-12-15 (×2): qty 30, 30d supply, fill #9

## 2024-03-05 ENCOUNTER — Other Ambulatory Visit (HOSPITAL_BASED_OUTPATIENT_CLINIC_OR_DEPARTMENT_OTHER): Payer: Self-pay

## 2024-03-08 ENCOUNTER — Other Ambulatory Visit (HOSPITAL_BASED_OUTPATIENT_CLINIC_OR_DEPARTMENT_OTHER): Payer: Self-pay

## 2024-03-09 ENCOUNTER — Other Ambulatory Visit (HOSPITAL_BASED_OUTPATIENT_CLINIC_OR_DEPARTMENT_OTHER): Payer: Self-pay

## 2024-03-10 ENCOUNTER — Other Ambulatory Visit (HOSPITAL_BASED_OUTPATIENT_CLINIC_OR_DEPARTMENT_OTHER): Payer: Self-pay

## 2024-03-11 ENCOUNTER — Other Ambulatory Visit (HOSPITAL_BASED_OUTPATIENT_CLINIC_OR_DEPARTMENT_OTHER): Payer: Self-pay

## 2024-03-19 ENCOUNTER — Other Ambulatory Visit (HOSPITAL_BASED_OUTPATIENT_CLINIC_OR_DEPARTMENT_OTHER): Payer: Self-pay

## 2024-03-30 ENCOUNTER — Other Ambulatory Visit (HOSPITAL_BASED_OUTPATIENT_CLINIC_OR_DEPARTMENT_OTHER): Payer: Self-pay

## 2024-04-19 ENCOUNTER — Other Ambulatory Visit (HOSPITAL_BASED_OUTPATIENT_CLINIC_OR_DEPARTMENT_OTHER): Payer: Self-pay

## 2024-04-28 ENCOUNTER — Other Ambulatory Visit (HOSPITAL_BASED_OUTPATIENT_CLINIC_OR_DEPARTMENT_OTHER): Payer: Self-pay

## 2024-05-26 ENCOUNTER — Other Ambulatory Visit (HOSPITAL_BASED_OUTPATIENT_CLINIC_OR_DEPARTMENT_OTHER): Payer: Self-pay

## 2024-05-27 ENCOUNTER — Other Ambulatory Visit (HOSPITAL_BASED_OUTPATIENT_CLINIC_OR_DEPARTMENT_OTHER): Payer: Self-pay

## 2024-06-23 ENCOUNTER — Other Ambulatory Visit (HOSPITAL_BASED_OUTPATIENT_CLINIC_OR_DEPARTMENT_OTHER): Payer: Self-pay

## 2024-07-14 ENCOUNTER — Other Ambulatory Visit (HOSPITAL_BASED_OUTPATIENT_CLINIC_OR_DEPARTMENT_OTHER): Payer: Self-pay

## 2024-07-22 ENCOUNTER — Other Ambulatory Visit (HOSPITAL_COMMUNITY): Payer: Self-pay

## 2024-07-22 ENCOUNTER — Other Ambulatory Visit (HOSPITAL_BASED_OUTPATIENT_CLINIC_OR_DEPARTMENT_OTHER): Payer: Self-pay

## 2024-07-26 ENCOUNTER — Other Ambulatory Visit (HOSPITAL_BASED_OUTPATIENT_CLINIC_OR_DEPARTMENT_OTHER): Payer: Self-pay

## 2024-08-12 ENCOUNTER — Other Ambulatory Visit: Payer: Self-pay

## 2024-08-12 ENCOUNTER — Other Ambulatory Visit (HOSPITAL_BASED_OUTPATIENT_CLINIC_OR_DEPARTMENT_OTHER): Payer: Self-pay

## 2024-08-12 ENCOUNTER — Other Ambulatory Visit (HOSPITAL_COMMUNITY): Payer: Self-pay

## 2024-08-17 ENCOUNTER — Other Ambulatory Visit (HOSPITAL_BASED_OUTPATIENT_CLINIC_OR_DEPARTMENT_OTHER): Payer: Self-pay

## 2024-08-27 ENCOUNTER — Other Ambulatory Visit (HOSPITAL_BASED_OUTPATIENT_CLINIC_OR_DEPARTMENT_OTHER): Payer: Self-pay

## 2024-09-08 ENCOUNTER — Other Ambulatory Visit (HOSPITAL_BASED_OUTPATIENT_CLINIC_OR_DEPARTMENT_OTHER): Payer: Self-pay

## 2024-09-17 ENCOUNTER — Other Ambulatory Visit (HOSPITAL_BASED_OUTPATIENT_CLINIC_OR_DEPARTMENT_OTHER): Payer: Self-pay

## 2024-09-21 ENCOUNTER — Other Ambulatory Visit (HOSPITAL_BASED_OUTPATIENT_CLINIC_OR_DEPARTMENT_OTHER): Payer: Self-pay

## 2024-10-18 ENCOUNTER — Other Ambulatory Visit (HOSPITAL_BASED_OUTPATIENT_CLINIC_OR_DEPARTMENT_OTHER): Payer: Self-pay

## 2024-10-26 ENCOUNTER — Other Ambulatory Visit (HOSPITAL_BASED_OUTPATIENT_CLINIC_OR_DEPARTMENT_OTHER): Payer: Self-pay

## 2024-11-16 ENCOUNTER — Other Ambulatory Visit (HOSPITAL_BASED_OUTPATIENT_CLINIC_OR_DEPARTMENT_OTHER): Payer: Self-pay

## 2024-11-16 ENCOUNTER — Other Ambulatory Visit: Payer: Self-pay

## 2024-11-22 ENCOUNTER — Other Ambulatory Visit (HOSPITAL_BASED_OUTPATIENT_CLINIC_OR_DEPARTMENT_OTHER): Payer: Self-pay

## 2024-12-14 ENCOUNTER — Other Ambulatory Visit (HOSPITAL_BASED_OUTPATIENT_CLINIC_OR_DEPARTMENT_OTHER): Payer: Self-pay

## 2024-12-21 ENCOUNTER — Other Ambulatory Visit (HOSPITAL_BASED_OUTPATIENT_CLINIC_OR_DEPARTMENT_OTHER): Payer: Self-pay
